# Patient Record
Sex: Female | Born: 1990 | Race: Black or African American | Hispanic: No | Marital: Single | Smoking: Former smoker
Health system: Southern US, Community
[De-identification: ages and names within clinical notes are randomized; demographics above are authoritative.]

## PROBLEM LIST (undated history)

## (undated) DIAGNOSIS — R569 Unspecified convulsions: Secondary | ICD-10-CM

## (undated) DIAGNOSIS — D649 Anemia, unspecified: Secondary | ICD-10-CM

## (undated) DIAGNOSIS — D496 Neoplasm of unspecified behavior of brain: Secondary | ICD-10-CM

## (undated) DIAGNOSIS — F3112 Bipolar disorder, current episode manic without psychotic features, moderate: Secondary | ICD-10-CM

## (undated) HISTORY — PX: BRAIN SURGERY: SHX531

## (undated) HISTORY — PX: MOUTH SURGERY: SHX715

---
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
## 2011-03-01 LAB — URINALYSIS, ROUTINE W REFLEX MICROSCOPIC
Nitrite: NEGATIVE
Protein, ur: NEGATIVE mg/dL
Specific Gravity, Urine: 1.024 (ref 1.005–1.030)
Urobilinogen, UA: 1 mg/dL (ref 0.0–1.0)

## 2011-03-01 LAB — POCT I-STAT, CHEM 8
BUN: 9 mg/dL (ref 6–23)
Creatinine, Ser: 0.8 mg/dL (ref 0.4–1.2)
Potassium: 3.9 mEq/L (ref 3.5–5.1)
Sodium: 141 mEq/L (ref 135–145)

## 2011-03-01 LAB — CBC
HCT: 35.9 % — ABNORMAL LOW (ref 36.0–46.0)
Platelets: 253 10*3/uL (ref 150–400)
RDW: 11.9 % (ref 11.5–15.5)
WBC: 7 10*3/uL (ref 4.0–10.5)

## 2011-03-01 LAB — DIFFERENTIAL
Basophils Relative: 1 % (ref 0–1)
Eosinophils Relative: 2 % (ref 0–5)
Monocytes Absolute: 0.5 10*3/uL (ref 0.1–1.0)
Monocytes Relative: 7 % (ref 3–12)
Neutro Abs: 3.7 10*3/uL (ref 1.7–7.7)

## 2011-03-01 LAB — POCT PREGNANCY, URINE

## 2011-03-01 LAB — COMPREHENSIVE METABOLIC PANEL
ALT: 12 U/L (ref 0–35)
AST: 16 U/L (ref 0–37)
Albumin: 4.4 g/dL (ref 3.5–5.2)
Chloride: 105 mEq/L (ref 96–112)
Creatinine, Ser: 0.67 mg/dL (ref 0.4–1.2)
Potassium: 3.7 mEq/L (ref 3.5–5.1)
Sodium: 135 mEq/L (ref 135–145)
Total Bilirubin: 0.6 mg/dL (ref 0.3–1.2)

## 2011-04-28 NOTE — Discharge Summary (Signed)
[name], [name] NO.:  [id number]   MEDICAL RECORD NO.:  [id number]          PATIENT TYPE:  OBV   LOCATION:  6149                         FACILITY:  [facility]   PHYSICIAN:  Levander Campion, M.D.  DATE OF BIRTH:  [birth year]   DATE OF ADMISSION:  04/12/2007  DATE OF DISCHARGE:  04/13/2007                               DISCHARGE SUMMARY   REASON FOR HOSPITALIZATION:  Seizure-like activity.   SIGNIFICANT FINDINGS:  Carla Munoz is a 20 year old female, with no prior  history of seizure disorder, who presented with a 80-month history of  twitching off and on in all body parts, randomly, for a few seconds at  the time and a 4-day history of episodes preceded by fatigue, blurred  vision, heavy feeling in chest, difficulty breathing and left lower  extremity dragging, in which she has eye rolling, eyelash fluttering and  falling to the floor without memory for the event.  She also has a 4-day  history of burning line on the top of her scalp and intensifies prior to  these events.  Two events were witnessed by teachers at school on  Monday.  Three events on Tuesday.  On Tuesday, the school nurse called  her grandmother to take her to the emergency department.  EMS was not  called on any event.  CT scan of the head was within normal limits, and  MRI of the brain was within normal limits, and EEG was within normal  limits.  She was admitted for observation.  Neurology was consulted and  Dr. Orlin Hilding saw the patient.  A neuro exam was within normal limits.  No  treatment was recommended at this time, and Dr. Orlin Hilding recommended  follow up with Dr. Sharene Skeans.  If the episodes were to recur, she would  consider outpatient ambulatory EEG.  She had no episodes while in the  hospital.  She does have significant stressors in her life with her  grandmother, who has primary custody, having major medical problems and  her mother having passed away when she was 51 years old.  Dr. Lindie Spruce was  consulted and the patient is considering psychiatric follow up with Dr.  Lindie Spruce.   OPERATIONS AND PROCEDURES:  EEG.   FINAL DIAGNOSES:  Seizure-like spells.   DISCHARGE MEDICATIONS AND INSTRUCTIONS:  The patient is to continue  taking her albuterol inhaler p.r.n. for her history of asthma.  Pending  results initially to be followed, none.  Follow up:  The patient is to  follow up with Dr. Sharene Skeans on Monday, Apr 18, 2007, at 10:15 a.m.  She  is to call for follow up at our primary care physician, Dr. Steva Ready, in  [place] within 2 weeks.  Discharge weight is 62.4 kilograms.  Discharge  condition is stable.           ______________________________  Levander Campion, M.D.     JH/MEDQ  D:  04/13/2007  T:  04/13/2007  Job:  [id number]

## 2011-04-28 NOTE — Procedures (Signed)
EEG NUMBER:  [id number]   CLINICAL HISTORY:  Patient is a 20 year old with a 68-month history of  twitching activity and a 4-day history of seizures, 780.39.  Study is  being done to look for the presence of an epileptic focus.   PROCEDURE:  The tracing is carried out on a 32-channel digital Cadwell  recorder reformatted into 16-channel montages with one devoted to EKG.  The patient was awake and asleep during the recording.  The  [employer] 10/20 system lead placement was used.   She takes Tylenol.   DESCRIPTION FINDINGS:  Dominant frequency is a 9-11 Hz, 20-40 microvolt  activity that is well modulated and regulated and attenuates partially  with eye opening.   Background activity consists of a predominately alpha and theta range  activity.  There is a brief portion of dysrhythmic theta range activity  in the left mid temporal region but this is not seen throughout the  record.  There was no interictal epileptiform activity in the form of  spikes or sharp waves.  The patient drifts into natural sleep with  vertex sharp waves and symmetric and synchronous sleep spindles.   Activating procedures with hyperventilation caused arousal in the  background.  Photic stimulation induced a driving response at 9 and 11  Hz.   EKG showed a sinus arrhythmia with ventricular response of 78 beats per  minute.   IMPRESSION:  In the waking state and natural sleep, this record is  normal.      Deanna Artis. Sharene Skeans, M.D.  Electronically Signed     FAO:ZHYQ  D:  04/13/2007 12:44:41  T:  04/13/2007 14:48:24  Job #:  [id number]   cc:   Orie Rout, M.D.  Fax: [phone]

## 2011-04-28 NOTE — Consult Note (Signed)
NAMEUNDRA, TREMBATH NO.:  [id number]   MEDICAL RECORD NO.:  [id number]          PATIENT TYPE:  OBV   LOCATION:  6149                         FACILITY:  [facility]   PHYSICIAN:  Gustavus Messing. Orlin Hilding, M.D.DATE OF BIRTH:  November 13, 1991   DATE OF CONSULTATION:  04/13/2007  DATE OF DISCHARGE:                                 CONSULTATION   NEUROLOGY CONSULTATION:   CHIEF COMPLAINT:  Possible seizure.   HISTORY OF PRESENT ILLNESS:  Carla Munoz is a 20 year old right-handed  otherwise healthy young lady who had some jaw surgery for a cyst in  February of 2008.  On the way home in the car, she had a series of  possible seizures that were attributed to medication.  She was treated  at the emergency room and released.  She was doing fine until about four  days ago when she began having isolated jerking of all four extremities  in a random pattern with no temporal pattern, no loss of consciousness.  By her description, sounds like it was myoclonic.  Then about two days  ago, she was at school and while seated at her desk had several episodes  of loss of consciousness preceded by her awareness with a sharp head  pain which she describes as being in a line and describes it as burning  and then dragging of her left leg.  The episodes have been variously  described as her holding her head and saying she did not feel well,  complaining that her head hurt, her eyes fluttering, face bright red,  left side twitching, jerking really bad, going out of for a minute and  then coming back and saying I am here; I am okay, 30 to 60-second  period where she was unresponsive to her name, phasing out with eyes  opened and staring, tongue dropping out of her mouth on the left side  for 60 seconds and similar type of things.  There was no history of  incontinence, no history of tongue biting, no clear history of tonic-  clonic seizure activity.  She says that she went back to school and had  four  similar episodes the next day which was yesterday, day of  admission, so she was seen by her primary who recommended admission for  workup.   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  She was a term baby without  complications at delivery.  No hospitalizations.  Up-to-date on her  immunizations.  Only medicine she is taking is albuterol on a p.r.n.  basis.   ALLERGIES:  No known drug allergies.   SOCIAL HISTORY:  She was raised by her grandmother, her mother died of  an intracerebral aneurysm, lives in [place].  Father is unknown.  She is  in tenth grade at [employer].  She dances.  She is  involved in [employer] competitions and poetry.  No alcohol, illicit drug  or significant social stressors except for a lot going on at school.   FAMILY HISTORY:  Positive for the  aneurysm.   OBJECTIVE:  VITAL SIGNS:  On exam, her vital signs are stable.  She is  afebrile.  HEENT:  Head is normocephalic, atraumatic.  NECK:  Supple without bruits.  HEART:  Regular rate and rhythm.  NEUROLOGICAL EXAM:  Mental status:  She is awake and alert, oriented  fully.  Normal language and cognition and seems somewhat unconcerned  about her history of these events.  Cranial nerves:  Her pupils are  equal and reactive.  Visual fields are full.  Extraocular movements are  intact.  Facial sensation:  There is normal facial motor activities.  Normal hearing is intact.  Palate is symmetric.  Tongue is midline.  There is no evidence of tongue lacerations.  Motor exam:  She has normal  station and gait, normal bulk, tone and strength, 5/5 strength in all  four extremities.  No drift or satelliting.  No vesiculations, atrophy  or tremor.  No myoclonus that I can observe.  Deep tendon reflexes are  1+.  She has downgoing toes.  Coordination, finger-to-nose, heel-to-shin  normal.  Sensory is normal.   EEG is normal.  MRI is  normal.  Urine drug skin is normal.  CT is  normal.   IMPRESSION:  Various spells by description, some episodes of myoclonus  without loss of consciousness as well as some episodes of loss of  consciousness or lapse of awareness with head, eyes or tongue deviating.  I do not hear any clear descriptions of generalized tonic-clonic  activity, tongue biting or incontinence.  It is not a very cohesive  picture.  Could be seizure, but no obvious source or history of such.   RECOMMENDATIONS:  I think it would be okay to discharge her at this  time.  I would not treat her with anticonvulsants at this point.  If her  spells recur, she should follow up with Dr. Sharene Skeans as an outpatient,  probably with 48-hour ambulatory EEG.      Catherine A. Orlin Hilding, M.D.  Electronically Signed     CAW/MEDQ  D:  04/13/2007  T:  04/13/2007  Job:  [id number]

## 2014-11-30 ENCOUNTER — Encounter (HOSPITAL_COMMUNITY): Payer: Self-pay | Admitting: *Deleted

## 2014-11-30 ENCOUNTER — Emergency Department (HOSPITAL_COMMUNITY)
Admission: EM | Admit: 2014-11-30 | Discharge: 2014-11-30 | Disposition: A | Discharge status: Home / Self Care | Payer: Self-pay | Attending: Emergency Medicine | Admitting: Emergency Medicine

## 2014-11-30 DIAGNOSIS — Z8669 Personal history of other diseases of the nervous system and sense organs: Secondary | ICD-10-CM | POA: Insufficient documentation

## 2014-11-30 DIAGNOSIS — R6889 Other general symptoms and signs: Secondary | ICD-10-CM

## 2014-11-30 DIAGNOSIS — R197 Diarrhea, unspecified: Secondary | ICD-10-CM | POA: Insufficient documentation

## 2014-11-30 DIAGNOSIS — Z720 Tobacco use: Secondary | ICD-10-CM | POA: Insufficient documentation

## 2014-11-30 DIAGNOSIS — R0981 Nasal congestion: Secondary | ICD-10-CM | POA: Insufficient documentation

## 2014-11-30 DIAGNOSIS — R05 Cough: Secondary | ICD-10-CM | POA: Insufficient documentation

## 2014-11-30 DIAGNOSIS — M791 Myalgia: Secondary | ICD-10-CM | POA: Insufficient documentation

## 2014-11-30 DIAGNOSIS — R112 Nausea with vomiting, unspecified: Secondary | ICD-10-CM | POA: Insufficient documentation

## 2014-11-30 DIAGNOSIS — R51 Headache: Secondary | ICD-10-CM | POA: Insufficient documentation

## 2014-11-30 DIAGNOSIS — R509 Fever, unspecified: Secondary | ICD-10-CM | POA: Insufficient documentation

## 2014-11-30 HISTORY — DX: Unspecified convulsions: R56.9

## 2014-11-30 MED ORDER — IBUPROFEN 800 MG PO TABS
800.0000 mg | ORAL_TABLET | Freq: Once | ORAL | Status: AC
  Administered 2014-11-30: 800 mg via ORAL
  Filled 2014-11-30: qty 1

## 2014-11-30 MED ORDER — HYDROCODONE-HOMATROPINE 5-1.5 MG/5ML PO SYRP
5.0000 mL | ORAL_SOLUTION | Freq: Once | ORAL | Status: AC
  Administered 2014-11-30: 5 mL via ORAL
  Filled 2014-11-30: qty 5

## 2014-11-30 MED ORDER — GUAIFENESIN 100 MG/5ML PO LIQD: 100.0000 mg | ORAL | Status: DC | PRN

## 2014-11-30 MED ORDER — LOPERAMIDE HCL 2 MG PO CAPS: 2.0000 mg | ORAL_CAPSULE | Freq: Four times a day (QID) | ORAL | Status: DC | PRN

## 2014-11-30 MED ORDER — ONDANSETRON 4 MG PO TBDP: 4.0000 mg | ORAL_TABLET | Freq: Three times a day (TID) | ORAL | Status: DC | PRN

## 2014-11-30 NOTE — Discharge Instructions (Signed)
Please follow up with your primary care physician in 1-2 days. If you do not have one please call the [place] number listed above. Please use medications as prescribed. Please read all discharge instructions and return precautions.   Influenza Influenza ("the flu") is a viral infection of the respiratory tract. It occurs more often in winter months because people spend more time in close contact with one another. Influenza can make you feel very sick. Influenza easily spreads from person to person (contagious). CAUSES  Influenza is caused by a virus that infects the respiratory tract. You can catch the virus by breathing in droplets from an infected person's cough or sneeze. You can also catch the virus by touching something that was recently contaminated with the virus and then touching your mouth, nose, or eyes. RISKS AND COMPLICATIONS You may be at risk for a more severe case of influenza if you smoke cigarettes, have diabetes, have chronic heart disease (such as heart failure) or lung disease (such as asthma), or if you have a weakened immune system. Elderly people and pregnant women are also at risk for more serious infections. The most common problem of influenza is a lung infection (pneumonia). Sometimes, this problem can require emergency medical care and may be life threatening. SIGNS AND SYMPTOMS  Symptoms typically last 4 to 10 days and may include:  Fever.  Chills.  Headache, body aches, and muscle aches.  Sore throat.  Chest discomfort and cough.  Poor appetite.  Weakness or feeling tired.  Dizziness.  Nausea or vomiting. DIAGNOSIS  Diagnosis of influenza is often made based on your history and a physical exam. A nose or throat swab test can be done to confirm the diagnosis. TREATMENT  In mild cases, influenza goes away on its own. Treatment is directed at relieving symptoms. For more severe cases, your health care provider may prescribe antiviral  medicines to shorten the sickness. Antibiotic medicines are not effective because the infection is caused by a virus, not by bacteria. HOME CARE INSTRUCTIONS  Take medicines only as directed by your health care provider.  Use a cool mist humidifier to make breathing easier.  Get plenty of rest until your temperature returns to normal. This usually takes 3 to 4 days.  Drink enough fluid to keep your urine clear or pale yellow.  Cover yourmouth and nosewhen coughing or sneezing,and wash your handswellto prevent thevirusfrom spreading.  Stay homefromwork orschool untilthe fever is gonefor at least 36full day. PREVENTION  An annual influenza vaccination (flu shot) is the best way to avoid getting influenza. An annual flu shot is now routinely recommended for all adults in the [place] IF:  You experiencechest pain, yourcough worsens,or you producemore mucus.  Youhave nausea,vomiting, ordiarrhea.  Your fever returns or gets worse. SEEK IMMEDIATE MEDICAL CARE IF:  You havetrouble breathing, you become short of breath,or your skin ornails becomebluish.  You have severe painor stiffnessin the neck.  You develop a sudden headache, or pain in the face or ear.  You have nausea or vomiting that you cannot control. MAKE SURE YOU:   Understand these instructions.  Will watch your condition.  Will get help right away if you are not doing well or get worse. Document Released: 11/27/2000 Document Revised: 04/16/2014 Document Reviewed: 02/29/2012 [facility] Patient Information 2015 [place], [place]. This information is not intended to replace advice given to you by your health care provider. Make sure you discuss any questions you have with your health  care provider.

## 2014-11-30 NOTE — ED Notes (Signed)
Pt reports having not feeling well x 2 days. Having headache, bodyaches, n/v/d. No acute distress noted at triage.

## 2014-11-30 NOTE — ED Provider Notes (Signed)
CSN: [id number]     Arrival date & time 11/30/14  1843 History   First MD Initiated Contact with Patient 11/30/14 1921     Chief Complaint  Patient presents with  . Influenza     (Consider location/radiation/quality/duration/timing/severity/associated sxs/prior Treatment) HPI Comments: Patient is a 23 year old female presented to the emergency department for 2 days of subjective fever, chills, generalized headache, myalgias, arthralgias, nausea, vomiting, diarrhea, cough. She has tried [facility] powders with no improvement. No modifying factors identified. She does have 2 positive sick contacts with upper respiratory symptoms at home. She did not receive her influenza vaccination this year yet. No history of IV drug use.  Patient is a 23 y.o. female presenting with flu symptoms.  Influenza Presenting symptoms: cough, diarrhea, fever (subjective), headache, nausea and vomiting   Associated symptoms: chills and nasal congestion     Past Medical History  Diagnosis Date  . Seizures    History reviewed. No pertinent past surgical history. History reviewed. No pertinent family history. History  Substance Use Topics  . Smoking status: Current Every Day Smoker    Types: Cigarettes  . Smokeless tobacco: Not on file  . Alcohol Use: No   OB History    No data available     Review of Systems  Constitutional: Positive for fever (subjective) and chills.  HENT: Positive for congestion.   Respiratory: Positive for cough.   Gastrointestinal: Positive for nausea, vomiting and diarrhea. Negative for abdominal pain.  Neurological: Positive for headaches. Negative for syncope, weakness and numbness.  All other systems reviewed and are negative.     Allergies  Review of patient's allergies indicates no known allergies.  Home Medications   Prior to Admission medications   Medication Sig Start Date End Date Taking? Authorizing Provider  guaiFENesin (ROBITUSSIN) 100 MG/5ML liquid Take 5-10 mLs  (100-200 mg total) by mouth every 4 (four) hours as needed for cough. 11/30/14   Clearance Chenault L Nema Oatley, PA-C  loperamide (IMODIUM) 2 MG capsule Take 1 capsule (2 mg total) by mouth 4 (four) times daily as needed for diarrhea or loose stools. 11/30/14   Elis Rawlinson L Sharece Fleischhacker, PA-C  ondansetron (ZOFRAN ODT) 4 MG disintegrating tablet Take 1 tablet (4 mg total) by mouth every 8 (eight) hours as needed for nausea or vomiting. 11/30/14   Anderson Malta L Harlem Bula, PA-C   BP 103/67 mmHg  Pulse 68  Temp(Src) 98.3 F (36.8 C) (Oral)  Resp 18  SpO2 97%  LMP 11/16/2014 Physical Exam  Constitutional: She is oriented to person, place, and time. She appears well-developed and well-nourished. No distress.  HENT:  Head: Normocephalic and atraumatic.  Right Ear: Hearing, tympanic membrane, external ear and ear canal normal.  Left Ear: Hearing, tympanic membrane, external ear and ear canal normal.  Nose: Nose normal.  Mouth/Throat: Uvula is midline, oropharynx is clear and moist and mucous membranes are normal. No oropharyngeal exudate.  Eyes: Conjunctivae are normal.  Neck: Normal range of motion. Neck supple.  Cardiovascular: Normal rate, regular rhythm and normal heart sounds.   Pulmonary/Chest: Effort normal and breath sounds normal. No respiratory distress.  Abdominal: Soft. Bowel sounds are normal. She exhibits no distension. There is no tenderness. There is no rebound and no guarding.  Musculoskeletal: Normal range of motion.  Lymphadenopathy:    She has no cervical adenopathy.  Neurological: She is alert and oriented to person, place, and time.  Skin: Skin is warm and dry. She is not diaphoretic.  Psychiatric: She has a normal mood and  affect.  Nursing note and vitals reviewed.   ED Course  Procedures (including critical care time) Medications  ibuprofen (ADVIL,MOTRIN) tablet 800 mg (800 mg Oral Given 11/30/14 2004)  HYDROcodone-homatropine (HYCODAN) 5-1.5 MG/5ML syrup 5 mL (5 mLs Oral  Given 11/30/14 2004)    Labs Review Labs Reviewed - No data to display  Imaging Review No results found.   EKG Interpretation None      MDM   Final diagnoses:  Flu-like symptoms    Filed Vitals:   11/30/14 2050  BP: 103/67  Pulse: 68  Temp:   Resp:    Afebrile, NAD, non-toxic appearing, AAOx4.  Patient with symptoms consistent with influenza.  Vitals are stable, low-grade fever.  No signs of dehydration, tolerating PO's.  Lungs are clear. Due to patient's presentation and physical exam a chest x-ray was not ordered bc likely diagnosis of flu.  Discussed the cost versus benefit of Tamiflu treatment with the patient.  The patient understands that symptoms are greater than the recommended 24-48 hour window of treatment.  Patient will be discharged with instructions to orally hydrate, rest, and use over-the-counter medications such as anti-inflammatories ibuprofen and Aleve for muscle aches and Tylenol for fever.  Patient will also be given a cough suppressant.  Patient is stable at time of discharge    Harlow Mares, PA-C 11/30/14 2108  Charlesetta Shanks, MD 11/30/14 2234

## 2014-12-12 ENCOUNTER — Encounter (HOSPITAL_COMMUNITY): Payer: Self-pay

## 2014-12-12 ENCOUNTER — Emergency Department (HOSPITAL_COMMUNITY): Payer: Self-pay

## 2014-12-12 ENCOUNTER — Emergency Department (HOSPITAL_COMMUNITY)
Admission: EM | Admit: 2014-12-12 | Discharge: 2014-12-12 | Disposition: A | Discharge status: Home / Self Care | Payer: Self-pay | Attending: Emergency Medicine | Admitting: Emergency Medicine

## 2014-12-12 DIAGNOSIS — Z86011 Personal history of benign neoplasm of the brain: Secondary | ICD-10-CM | POA: Insufficient documentation

## 2014-12-12 DIAGNOSIS — R569 Unspecified convulsions: Secondary | ICD-10-CM | POA: Insufficient documentation

## 2014-12-12 DIAGNOSIS — Y9289 Other specified places as the place of occurrence of the external cause: Secondary | ICD-10-CM | POA: Insufficient documentation

## 2014-12-12 DIAGNOSIS — Y9389 Activity, other specified: Secondary | ICD-10-CM | POA: Insufficient documentation

## 2014-12-12 DIAGNOSIS — Z720 Tobacco use: Secondary | ICD-10-CM | POA: Insufficient documentation

## 2014-12-12 DIAGNOSIS — W07XXXA Fall from chair, initial encounter: Secondary | ICD-10-CM | POA: Insufficient documentation

## 2014-12-12 DIAGNOSIS — R52 Pain, unspecified: Secondary | ICD-10-CM

## 2014-12-12 DIAGNOSIS — Y990 Civilian activity done for income or pay: Secondary | ICD-10-CM | POA: Insufficient documentation

## 2014-12-12 DIAGNOSIS — W19XXXA Unspecified fall, initial encounter: Secondary | ICD-10-CM

## 2014-12-12 DIAGNOSIS — S0990XA Unspecified injury of head, initial encounter: Secondary | ICD-10-CM | POA: Insufficient documentation

## 2014-12-12 HISTORY — DX: Neoplasm of unspecified behavior of brain: D49.6

## 2014-12-12 LAB — BASIC METABOLIC PANEL
ANION GAP: 6 (ref 5–15)
BUN: 9 mg/dL (ref 6–23)
CHLORIDE: 109 meq/L (ref 96–112)
CO2: 22 mmol/L (ref 19–32)
Calcium: 9.1 mg/dL (ref 8.4–10.5)
Creatinine, Ser: 0.56 mg/dL (ref 0.50–1.10)
Glucose, Bld: 86 mg/dL (ref 70–99)
POTASSIUM: 4 mmol/L (ref 3.5–5.1)
SODIUM: 137 mmol/L (ref 135–145)

## 2014-12-12 LAB — CBC
HEMATOCRIT: 38 % (ref 36.0–46.0)
Hemoglobin: 12.9 g/dL (ref 12.0–15.0)
MCH: 31.1 pg (ref 26.0–34.0)
MCHC: 33.9 g/dL (ref 30.0–36.0)
MCV: 91.6 fL (ref 78.0–100.0)
PLATELETS: 233 10*3/uL (ref 150–400)
RBC: 4.15 MIL/uL (ref 3.87–5.11)
RDW: 11.7 % (ref 11.5–15.5)
WBC: 6.6 10*3/uL (ref 4.0–10.5)

## 2014-12-12 LAB — I-STAT BETA HCG BLOOD, ED (MC, WL, AP ONLY)

## 2014-12-12 MED ORDER — ACETAMINOPHEN 325 MG PO TABS
650.0000 mg | ORAL_TABLET | Freq: Once | ORAL | Status: AC
Start: 2014-12-12 — End: 2014-12-12
  Administered 2014-12-12: 650 mg via ORAL
  Filled 2014-12-12: qty 2

## 2014-12-12 NOTE — ED Notes (Signed)
MD at bedside. 

## 2014-12-12 NOTE — ED Notes (Signed)
Pt brother stated that he wanted to take the pt home. Pt asked if she wanted to leave. Pt stated that she would stay if doctor said it was in her best interest to. Dr Ashok Cordia made aware.

## 2014-12-12 NOTE — ED Notes (Signed)
Per [facility]: Pt found on floor at work by co-worker. Pt was sitting in chair before coworker left room, when coworker came back to room pt was found on floor. Unknown how long pt was on floor. Pt was postictal when [facility] arrived on scene, symptoms have since resolved. Pt placed in C-collar because pt was complaining of neck pain that radiates down in between shoulder blades. No deformities and no tenderness noted by [facility].   Pt stated that she believes she had a seizure last night, last known seizure was last week.  Pt is not on medications for seizure due to insurance problems.  Pt does have a hx of seizures. 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

## 2014-12-12 NOTE — Discharge Instructions (Signed)
It was our pleasure to provide your ER care today - we hope that you feel better.  Rest. Drink plenty of fluids.  Take tylenol/advil as need.  No driving, or operating heavy machinery, until cleared to do so by your doctor/neurologist.  Follow up with neurologist in the next 1-2 weeks - see referral - call office today to arrange appointment.  Return to ER right away if worse, recurrent seizures, fainting, fevers, severe pain, other concern.    Seizure, Adult A seizure is abnormal electrical activity in the brain. Seizures usually last from 30 seconds to 2 minutes. There are various types of seizures. Before a seizure, you may have a warning sensation (aura) that a seizure is about to occur. An aura may include the following symptoms:   Fear or anxiety.  Nausea.  Feeling like the room is spinning (vertigo).  Vision changes, such as seeing flashing lights or spots. Common symptoms during a seizure include:  A change in attention or behavior (altered mental status).  Convulsions with rhythmic jerking movements.  Drooling.  Rapid eye movements.  Grunting.  Loss of bladder and bowel control.  Bitter taste in the mouth.  Tongue biting. After a seizure, you may feel confused and sleepy. You may also have an injury resulting from convulsions during the seizure. HOME CARE INSTRUCTIONS   If you are given medicines, take them exactly as prescribed by your health care provider.  Keep all follow-up appointments as directed by your health care provider.  Do not swim or drive or engage in risky activity during which a seizure could cause further injury to you or others until your health care provider says it is OK.  Get adequate rest.  Teach friends and family what to do if you have a seizure. They should:  Lay you on the ground to prevent a fall.  Put a cushion under your head.  Loosen any tight clothing around your neck.  Turn you on your side. If vomiting occurs, this  helps keep your airway clear.  Stay with you until you recover.  Know whether or not you need emergency care. SEEK IMMEDIATE MEDICAL CARE IF:  The seizure lasts longer than 5 minutes.  The seizure is severe or you do not wake up immediately after the seizure.  You have an altered mental status after the seizure.  You are having more frequent or worsening seizures. Someone should drive you to the emergency department or call local emergency services (911 in [place] MAKE SURE YOU:  Understand these instructions.  Will watch your condition.  Will get help right away if you are not doing well or get worse. Document Released: 11/27/2000 Document Revised: 09/20/2013 Document Reviewed: 07/12/2013 [facility] Patient Information 2015 [place], [place]. This information is not intended to replace advice given to you by your health care provider. Make sure you discuss any questions you have with your health care provider.

## 2014-12-12 NOTE — ED Notes (Signed)
Pt states that prior to having a seizure she "feels really really hot and I need to sit down".

## 2014-12-12 NOTE — ED Notes (Signed)
MD removed pts c-collar

## 2014-12-12 NOTE — ED Provider Notes (Signed)
CSN: [id number]     Arrival date & time 12/12/14  1004 History   First MD Initiated Contact with Patient 12/12/14 1009     Chief Complaint  Patient presents with  . Seizures     (Consider location/radiation/quality/duration/timing/severity/associated sxs/prior Treatment) Patient is a 23 y.o. female presenting with seizures. The history is provided by the patient and the EMS personnel.  Seizures pt w hx seizures, presents from work where pt found on floor by co-worker after having suspected seizure. ?briefly 'out of it' initially, however sensorium cleared by time of ED arrival.  Pt states she thinks she had seizure.  Pt indicates was at work, felt hot, faint, went to sit down in chair, and then to ground.  Pt indicates similar symptoms prior to previous seizures. Denies hx syncopal events.  Pt indicates slept poorly last pm, otherwise recent health at baseline. No fevers. No dysuria or gu c/o. No recent blood loss or heavy periods. No nvd recently, although had a nv illness a couple weeks ago. No abd pain. No cp or sob. No palpitations. No headache prior to event although after fall to ground c/o headache. Dull. Moderate. Pt indicates has seizure every few months, no recent increase in seizure frequency. Pt states regarding seizures has never been on meds for same, despite hx seizures since '9th grade'.  Pt states she doesn't have the 'grand mal' type of seizures, but that occasionally her body will stiffen or shake.  No incontinence or oral injuries w current or prior seizures.       Past Medical History  Diagnosis Date  . Seizures   . Brain tumor    History reviewed. No pertinent past surgical history. No family history on file. History  Substance Use Topics  . Smoking status: Current Every Day Smoker -- 0.50 packs/day    Types: Cigarettes  . Smokeless tobacco: Not on file  . Alcohol Use: Yes     Comment: socially   OB History    No data available     Review of Systems   Constitutional: Negative for fever and chills.  HENT: Negative for sore throat.   Eyes: Negative for pain and visual disturbance.  Respiratory: Negative for cough and shortness of breath.   Cardiovascular: Negative for chest pain and leg swelling.  Gastrointestinal: Negative for vomiting, abdominal pain, diarrhea and blood in stool.  Endocrine: Negative for polyuria.  Genitourinary: Negative for dysuria, flank pain and vaginal bleeding.  Musculoskeletal: Negative for back pain.  Skin: Negative for wound.  Neurological: Positive for seizures. Negative for weakness and numbness.  Hematological: Does not bruise/bleed easily.  Psychiatric/Behavioral: Negative for confusion.      Allergies  Review of patient's allergies indicates no known allergies.  Home Medications   Prior to Admission medications   Medication Sig Start Date End Date Taking? Authorizing Provider  guaiFENesin (ROBITUSSIN) 100 MG/5ML liquid Take 5-10 mLs (100-200 mg total) by mouth every 4 (four) hours as needed for cough. 11/30/14   Jennifer L Piepenbrink, PA-C  loperamide (IMODIUM) 2 MG capsule Take 1 capsule (2 mg total) by mouth 4 (four) times daily as needed for diarrhea or loose stools. 11/30/14   Jennifer L Piepenbrink, PA-C  ondansetron (ZOFRAN ODT) 4 MG disintegrating tablet Take 1 tablet (4 mg total) by mouth every 8 (eight) hours as needed for nausea or vomiting. 11/30/14   Anderson Malta L Piepenbrink, PA-C   BP 113/74 mmHg  Pulse 74  Temp(Src) 98.4 F (36.9 C) (Oral)  Resp 16  Ht 5\' 7"  (1.702 m)  Wt 147 lb (66.679 kg)  BMI 23.02 kg/m2  SpO2 100%  LMP 11/16/2014 Physical Exam  Constitutional: She is oriented to person, place, and time. She appears well-developed and well-nourished. No distress.  HENT:  Mouth/Throat: Oropharynx is clear and moist.  Mild tenderness posterior scalp. No oral injury.   Eyes: Conjunctivae are normal. Pupils are equal, round, and reactive to light. No scleral icterus.  Neck:  Neck supple. No tracheal deviation present.  Cardiovascular: Normal rate, regular rhythm, normal heart sounds and intact distal pulses.   Pulmonary/Chest: Effort normal and breath sounds normal. No respiratory distress. She exhibits no tenderness.  Abdominal: Soft. Normal appearance and bowel sounds are normal. She exhibits no distension. There is no tenderness.  Genitourinary:  No cva tenderness  Musculoskeletal: Normal range of motion. She exhibits no edema or tenderness.  Mid cervical tenderness, otherwise CTLS spine, non tender, aligned, no step off.   Neurological: She is alert and oriented to person, place, and time. No cranial nerve deficit.  Motor intact bil, stre 5/5 bil. sens intact.   Skin: Skin is warm and dry. No rash noted. She is not diaphoretic.  Psychiatric: She has a normal mood and affect.  Nursing note and vitals reviewed.   ED Course  Procedures (including critical care time) Labs Review  Results for orders placed or performed during the hospital encounter of 12/12/14  CBC  Result Value Ref Range   WBC 6.6 4.0 - 10.5 K/uL   RBC 4.15 3.87 - 5.11 MIL/uL   Hemoglobin 12.9 12.0 - 15.0 g/dL   HCT 38.0 36.0 - 46.0 %   MCV 91.6 78.0 - 100.0 fL   MCH 31.1 26.0 - 34.0 pg   MCHC 33.9 30.0 - 36.0 g/dL   RDW 11.7 11.5 - 15.5 %   Platelets 233 150 - 400 K/uL  Basic metabolic panel  Result Value Ref Range   Sodium 137 135 - 145 mmol/L   Potassium 4.0 3.5 - 5.1 mmol/L   Chloride 109 96 - 112 mEq/L   CO2 22 19 - 32 mmol/L   Glucose, Bld 86 70 - 99 mg/dL   BUN 9 6 - 23 mg/dL   Creatinine, Ser 0.56 0.50 - 1.10 mg/dL   Calcium 9.1 8.4 - 10.5 mg/dL   GFR calc non Af Amer >90 >90 mL/min   GFR calc Af Amer >90 >90 mL/min   Anion gap 6 5 - 15  I-Stat Beta hCG blood, ED ([facility], [facility], [facility] only)  Result Value Ref Range   I-stat hCG, quantitative <5.0 <5 mIU/mL   Comment 3           Ct Head Wo Contrast  12/12/2014   CLINICAL DATA:  Patient found down. Initial encounter.  Syncopal episode. Possible seizure. Seizure last week. History of seizure. Brain tumor diagnosed 3-4 years ago. Neck pain.  EXAM: CT HEAD WITHOUT CONTRAST  CT CERVICAL SPINE WITHOUT CONTRAST  TECHNIQUE: Multidetector CT imaging of the head and cervical spine was performed following the standard protocol without intravenous contrast. Multiplanar CT image reconstructions of the cervical spine were also generated.  COMPARISON:  MRI 04/12/2007.  CT 06/22/2013 and 06/16/2013.  FINDINGS: CT HEAD FINDINGS  No mass lesion, mass effect, midline shift, hydrocephalus, hemorrhage. No territorial ischemia or acute infarction. Unchanged calcification and ossification of the falx and tentorium.  There is an abnormal configuration of the clivus which is unchanged compared to the prior, compatible with congenital abnormality. Exostosis extends  off the central clivus contacting the medulla. No change from prior.  CT CERVICAL SPINE FINDINGS  Alignment: Levoconvex curve of the cervicothoracic spine. Compensatory dextroconvex curve with the apex at C2. No spondylolisthesis.  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 Fibrous fusion is probably present anteriorly. No findings to suggest instability.  Vertebrae: T1 spinous process shows failure of fusion. Segmentation is within normal limits. Negative for fracture.  Central canal: Patent.  Paraspinal soft tissues: Normal.  Lung apices: Normal.  Failure of fusion of the T1 spinous process.  IMPRESSION: 1. No acute intracranial abnormality. 2. 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 3. No acute cervical spine injury. 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   Electronically Signed   By: Dereck Ligas M.D.   On: 12/12/2014 11:15    Ct Cervical Spine Wo Contrast  12/12/2014   CLINICAL DATA:  Patient found down. Initial encounter. Syncopal episode. Possible seizure. Seizure last week. History of seizure. Brain tumor diagnosed 3-4 years ago. Neck pain.  EXAM: CT HEAD WITHOUT CONTRAST  CT CERVICAL SPINE WITHOUT CONTRAST  TECHNIQUE: Multidetector CT imaging of the head and cervical spine was performed following the standard protocol without intravenous contrast. Multiplanar CT image reconstructions of the cervical spine were also generated.  COMPARISON:  MRI 04/12/2007.  CT 06/22/2013 and 06/16/2013.  FINDINGS: CT HEAD FINDINGS  No mass lesion, mass effect, midline shift, hydrocephalus, hemorrhage. No territorial ischemia or acute infarction. Unchanged calcification and ossification of the falx and tentorium.  There is an abnormal configuration of the clivus which is unchanged compared to the prior, compatible with congenital abnormality. Exostosis extends off the central clivus contacting the medulla. No change from prior.  CT CERVICAL SPINE FINDINGS  Alignment: Levoconvex curve of the cervicothoracic spine. Compensatory dextroconvex curve with the apex at C2. No spondylolisthesis.  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 Fibrous fusion is probably present anteriorly. No findings to suggest instability.  Vertebrae: T1 spinous process shows failure of fusion. Segmentation is within normal limits. Negative for fracture.  Central canal: Patent.  Paraspinal soft tissues: Normal.  Lung apices: Normal.  Failure of fusion of the T1 spinous process.  IMPRESSION: 1. No acute intracranial abnormality. 2. 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 3. No acute cervical spine injury. 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   Electronically Signed   By: Dereck Ligas M.D.   On: 12/12/2014 11:15       EKG Interpretation   Date/Time:  Wednesday December 12 2014 10:06:30 EST Ventricular Rate:  69 PR Interval:  134 QRS Duration: 86 QT Interval:  373 QTC Calculation: 399 R Axis:   59 Text Interpretation:  Normal sinus rhythm Normal ECG Confirmed by Dylyn Mclaren   MD, Lennette Bihari ([id number]) on 12/12/2014 10:16:35 AM      MDM   Iv ns. Seizure precautions.  Reviewed nursing notes and prior charts for additional history.   Discussed cts w pt-  Pt/family state they have had bony abn clivus previously worked up, and will follow up with their provider.  No  seizure activity in ED.  Pt and family request d/c.   Pt does not want any medication currently, or rx, but states is willing to follow with neurology as outpatient.  Recheck spine nt.  Fam/pt again requesting d/c to home, asymptomatic, does not want urine or other tests.  Pt d/c to home.  Return precautions provided, and referral to neurology.    Mirna Mires, MD 12/12/14 [phone]

## 2014-12-12 NOTE — ED Notes (Signed)
Patient transported to CT 

## 2014-12-12 NOTE — ED Notes (Signed)
Pt given water, as directed by MD.

## 2014-12-25 ENCOUNTER — Emergency Department (HOSPITAL_COMMUNITY)
Admission: EM | Admit: 2014-12-25 | Discharge: 2014-12-25 | Disposition: A | Discharge status: Home / Self Care | Payer: Self-pay | Attending: Emergency Medicine | Admitting: Emergency Medicine

## 2014-12-25 ENCOUNTER — Emergency Department (HOSPITAL_COMMUNITY): Payer: Self-pay

## 2014-12-25 ENCOUNTER — Encounter (HOSPITAL_COMMUNITY): Payer: Self-pay | Admitting: Emergency Medicine

## 2014-12-25 DIAGNOSIS — Y9289 Other specified places as the place of occurrence of the external cause: Secondary | ICD-10-CM | POA: Insufficient documentation

## 2014-12-25 DIAGNOSIS — Z7982 Long term (current) use of aspirin: Secondary | ICD-10-CM | POA: Insufficient documentation

## 2014-12-25 DIAGNOSIS — S8992XA Unspecified injury of left lower leg, initial encounter: Secondary | ICD-10-CM | POA: Insufficient documentation

## 2014-12-25 DIAGNOSIS — D332 Benign neoplasm of brain, unspecified: Secondary | ICD-10-CM | POA: Insufficient documentation

## 2014-12-25 DIAGNOSIS — Y990 Civilian activity done for income or pay: Secondary | ICD-10-CM | POA: Insufficient documentation

## 2014-12-25 DIAGNOSIS — S199XXA Unspecified injury of neck, initial encounter: Secondary | ICD-10-CM | POA: Insufficient documentation

## 2014-12-25 DIAGNOSIS — Z3202 Encounter for pregnancy test, result negative: Secondary | ICD-10-CM | POA: Insufficient documentation

## 2014-12-25 DIAGNOSIS — Z720 Tobacco use: Secondary | ICD-10-CM | POA: Insufficient documentation

## 2014-12-25 DIAGNOSIS — G40909 Epilepsy, unspecified, not intractable, without status epilepticus: Secondary | ICD-10-CM | POA: Insufficient documentation

## 2014-12-25 DIAGNOSIS — S4992XA Unspecified injury of left shoulder and upper arm, initial encounter: Secondary | ICD-10-CM | POA: Insufficient documentation

## 2014-12-25 DIAGNOSIS — Y93E5 Activity, floor mopping and cleaning: Secondary | ICD-10-CM | POA: Insufficient documentation

## 2014-12-25 DIAGNOSIS — W19XXXA Unspecified fall, initial encounter: Secondary | ICD-10-CM

## 2014-12-25 DIAGNOSIS — R569 Unspecified convulsions: Secondary | ICD-10-CM

## 2014-12-25 DIAGNOSIS — W1830XA Fall on same level, unspecified, initial encounter: Secondary | ICD-10-CM | POA: Insufficient documentation

## 2014-12-25 LAB — BASIC METABOLIC PANEL
Anion gap: 11 (ref 5–15)
BUN: 13 mg/dL (ref 6–23)
CALCIUM: 9.3 mg/dL (ref 8.4–10.5)
CHLORIDE: 106 meq/L (ref 96–112)
CO2: 22 mmol/L (ref 19–32)
CREATININE: 0.67 mg/dL (ref 0.50–1.10)
GFR calc Af Amer: >90 mL/min (ref >90)
GFR calc non Af Amer: >90 mL/min (ref >90)
GLUCOSE: 76 mg/dL (ref 70–99)
Potassium: 3.9 mmol/L (ref 3.5–5.1)
Sodium: 139 mmol/L (ref 135–145)

## 2014-12-25 LAB — CBC
HCT: 36.8 % (ref 36.0–46.0)
HEMOGLOBIN: 12.6 g/dL (ref 12.0–15.0)
MCH: 30.7 pg (ref 26.0–34.0)
MCHC: 34.2 g/dL (ref 30.0–36.0)
MCV: 89.5 fL (ref 78.0–100.0)
PLATELETS: 262 10*3/uL (ref 150–400)
RBC: 4.11 MIL/uL (ref 3.87–5.11)
RDW: 11.7 % (ref 11.5–15.5)
WBC: 6.7 10*3/uL (ref 4.0–10.5)

## 2014-12-25 LAB — POC URINE PREG, ED: PREG TEST UR: NEGATIVE

## 2014-12-25 LAB — CBG MONITORING, ED: Glucose-Capillary: 74 mg/dL (ref 70–99)

## 2014-12-25 LAB — VALPROIC ACID LEVEL: Valproic Acid Lvl: <10 ug/mL — ABNORMAL LOW (ref 50.0–100.0)

## 2014-12-25 MED ORDER — KETOROLAC TROMETHAMINE 30 MG/ML IJ SOLN
30.0000 mg | Freq: Once | INTRAMUSCULAR | Status: AC
  Administered 2014-12-25: 30 mg via INTRAVENOUS
  Filled 2014-12-25: qty 1

## 2014-12-25 MED ORDER — HYDROCODONE-ACETAMINOPHEN 5-325 MG PO TABS
1.0000 | ORAL_TABLET | Freq: Once | ORAL | Status: AC
  Administered 2014-12-25: 1 via ORAL
  Filled 2014-12-25: qty 1

## 2014-12-25 NOTE — ED Notes (Signed)
PA at bedside.

## 2014-12-25 NOTE — ED Notes (Signed)
Pt arrives via EMS. Pt at work, had seizure and fell to ground. Pt c/o of head and neck pain, immobilized with spinal board and head blocks. Hx of seizure, supposed to be on depakote but unable to afford it at this time. Pt post ictal upon ems arrival, pt alert and oriented at this time. NAD

## 2014-12-25 NOTE — ED Notes (Signed)
C collar removed per PA Geiple's request

## 2014-12-25 NOTE — Discharge Instructions (Signed)
Please read and follow all provided instructions.  Your diagnoses today include:  1. Seizure-like activity   2. Fall     Tests performed today include:  X-ray of neck - no broken bones  Blood counts and electrolytes - normal  Urine test - no pregnancy  EKG - no problems  Vital signs. See below for your results today.   Medications prescribed:   None  Take any prescribed medications only as directed.  Home care instructions:  Follow any educational materials contained in this packet.  No driving until cleared by your doctor.   Follow-up instructions: Please follow-up with your primary care provider and the neurologist listed for further evaluation of your symptoms.   Return instructions:  SEEK IMMEDIATE MEDICAL ATTENTION IF:  There is confusion or drowsiness.   You cannot awaken the person.   You have more than one episode of vomiting.   You notice dizziness or unsteadiness which is getting worse, or inability to walk.   You have convulsions or unconsciousness.   You experience severe, persistent headaches not relieved by Tylenol.  You cannot use arms or legs normally.   There are changes in pupil sizes. (This is the black center in the colored part of the eye)   There is clear or bloody discharge from the nose or ears.   You have change in speech, vision, swallowing, or understanding.   Localized weakness, numbness, tingling, or change in bowel or bladder control.  You have any other emergent concerns.   Your vital signs today were: BP 96/73 mmHg   Pulse 66   Temp(Src) 98.3 F (36.8 C) (Oral)   Resp 14   SpO2 97%   LMP 12/23/2014 If your blood pressure (BP) was elevated above 135/85 this visit, please have this repeated by your doctor within one month. --------------

## 2014-12-25 NOTE — ED Provider Notes (Signed)
CSN: [id number]     Arrival date & time 12/25/14  1514 History   First MD Initiated Contact with Patient 12/25/14 1613     Chief Complaint  Patient presents with  . Seizures  . Fall     (Consider location/radiation/quality/duration/timing/severity/associated sxs/prior Treatment) HPI Comments: Patients with questionable history of seizure disorder, normal EEG in 2008, not on antiepileptics, currently unable to see neurologist because of insurance problems -- presents with complaint of seizure episode today. Patient was at work Engineer, structural. She reports that she felt bad and had no specific prodrome. She fell to the ground. No eyewitnesses available to give account of activity. Patient has been told that she has a jerking motion in the past. She currently complains of neck pain, left shoulder and left knee pain. Patient was placed on long spine board and in cervical spine collar prior to arrival by EMS. EMS reports postictal state upon their arrival. No nausea, vomiting. No vision changes. No tongue biting or incontinence. No chest pain, shortness of breath. Patient was seen in emergency department 2 weeks ago after a similar episode. She had negative head and cervical spine CT at that time. No other complaints.  The history is provided by the patient and medical records.    Past Medical History  Diagnosis Date  . Seizures   . Brain tumor    History reviewed. No pertinent past surgical history. History reviewed. No pertinent family history. History  Substance Use Topics  . Smoking status: Current Every Day Smoker -- 0.50 packs/day    Types: Cigarettes  . Smokeless tobacco: Not on file  . Alcohol Use: Yes     Comment: socially   OB History    No data available     Review of Systems  Constitutional: Positive for fatigue. Negative for fever.  HENT: Negative for rhinorrhea and sore throat.   Eyes: Negative for redness.  Respiratory: Negative for cough.   Cardiovascular: Negative  for chest pain.  Gastrointestinal: Negative for nausea, vomiting, abdominal pain and diarrhea.  Genitourinary: Negative for dysuria.  Musculoskeletal: Negative for myalgias.  Skin: Negative for rash.  Neurological: Positive for seizures and syncope. Negative for weakness and headaches.    Allergies  Review of patient's allergies indicates no known allergies.  Home Medications   Prior to Admission medications   Medication Sig Start Date End Date Taking? Authorizing Provider  aspirin-acetaminophen-caffeine (EXCEDRIN MIGRAINE) [phone] MG per tablet Take 2 tablets by mouth every 6 (six) hours as needed for headache.   Yes Historical Provider, MD   BP 105/68 mmHg  Pulse 71  Temp(Src) 98.3 F (36.8 C) (Oral)  SpO2 100%  LMP 12/23/2014   Physical Exam  Constitutional: She is oriented to person, place, and time. She appears well-developed and well-nourished.  HENT:  Head: Normocephalic and atraumatic.  Right Ear: Tympanic membrane, external ear and ear canal normal.  Left Ear: Tympanic membrane, external ear and ear canal normal.  Nose: Nose normal.  Mouth/Throat: Uvula is midline, oropharynx is clear and moist and mucous membranes are normal.  No lateral tongue biting.  Eyes: Conjunctivae, EOM and lids are normal. Pupils are equal, round, and reactive to light. Right eye exhibits no discharge. Left eye exhibits no discharge. Right eye exhibits no nystagmus. Left eye exhibits no nystagmus.  Neck: Neck supple.  Immobilized in c-collar. Lower c-spine midline tenderness.   Cardiovascular: Normal rate, regular rhythm and normal heart sounds.   Pulmonary/Chest: Effort normal and breath sounds normal.  Abdominal: Soft.  There is no tenderness.  Musculoskeletal:       Right shoulder: Normal.       Left shoulder: She exhibits tenderness. She exhibits normal range of motion and no bony tenderness.       Right elbow: Normal.      Left elbow: Normal.       Right wrist: Normal.       Left  wrist: Normal.       Right hip: Normal.       Left hip: Normal.       Right knee: Normal.       Left knee: She exhibits normal range of motion, no swelling and no effusion. Tenderness found.       Right ankle: Normal.       Left ankle: Normal.       Cervical back: She exhibits normal range of motion, no tenderness and no bony tenderness.       Thoracic back: Normal.       Lumbar back: Normal.  Neurological: She is alert and oriented to person, place, and time. She has normal strength and normal reflexes. No cranial nerve deficit or sensory deficit. She displays a negative Romberg sign. Coordination and gait normal. GCS eye subscore is 4. GCS verbal subscore is 5. GCS motor subscore is 6.  Skin: Skin is warm and dry.  Psychiatric: She has a normal mood and affect.  Nursing note and vitals reviewed.   ED Course  Procedures (including critical care time) Labs Review Labs Reviewed  VALPROIC ACID LEVEL - Abnormal; Notable for the following:    Valproic Acid Lvl <10.0 (*)    All other components within normal limits  CBC  BASIC METABOLIC PANEL  CBG MONITORING, ED  POC URINE PREG, ED    Imaging Review Dg Cervical Spine Complete  12/25/2014   CLINICAL DATA:  Fall.  Seizures.  Neck pain.  EXAM: CERVICAL SPINE  4+ VIEWS  COMPARISON:  12/12/2014  FINDINGS: Imaging was performed with patient wearing a cervical collar.  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  No significant prevertebral soft tissue swelling. No malalignment. No cervical fracture is identified. Stable dextroconvex upper cervical scoliosis as shown on prior CT scan. 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  No cervical spine fracture or subluxation is identified.  IMPRESSION: 1. No cervical spine fracture or in collar static instability identified. 2. Incidental note is made of lack of fusion of the anterior and posterior arches of C1, in the posterior arch of T1. This is stable.   Electronically Signed    By: Sherryl Barters M.D.   On: 12/25/2014 18:12     EKG Interpretation   Date/Time:  Tuesday December 25 2014 15:55:38 EST Ventricular Rate:  72 PR Interval:  153 QRS Duration: 80 QT Interval:  391 QTC Calculation: 428 R Axis:   69 Text Interpretation:  Sinus rhythm EKG WITHIN NORMAL LIMITS Confirmed by  DOCHERTY  MD, MEGAN [phone]) on 12/25/2014 5:44:28 PM       Patient seen and examined.    Vital signs reviewed and are as follows: BP 105/68 mmHg  Pulse 71  Temp(Src) 98.3 F (36.8 C) (Oral)  SpO2 100%  LMP 12/23/2014  7:38 PM c-collar removed by nurse after negative imaging. Patient has full range of motion in neck with residual soreness. Patient has done well during emergency department stay. No further seizure-like activity. EKG is normal. She is not orthostatic. Plain film imaging  of her neck is negative. Headache is improved with Toradol. Will discharge to home. We discussed driving restrictions. I will give her PCP and neurology follow-up. I encouraged her to follow-up as soon as she can. Patient encouraged to return to the emergency department with additional episodes of seizures, severe headache, other concerns.  MDM   Final diagnoses:  Fall  Seizure-like activity   Patient with possible seizure. Neg syncope/seizure work-up. No additional seizures in ED. No focal neuro deficits. No c-spine problems. Unfortunately she does not have good follow-up. I have provided referrals today for PCP/neuro.   No dangerous or life-threatening conditions suspected or identified by history, physical exam, and by work-up. No indications for hospitalization identified.       Carlisle Cater, PA-C 12/25/14 1946  Ernestina Patches, MD 12/26/14 1227

## 2014-12-25 NOTE — ED Notes (Signed)
Patient transported to X-ray 

## 2014-12-25 NOTE — ED Notes (Signed)
[facility] contacted in reference to UPT, advised results were just put into computer.

## 2015-01-16 ENCOUNTER — Encounter (HOSPITAL_COMMUNITY): Payer: Self-pay | Admitting: Physical Medicine and Rehabilitation

## 2015-01-16 ENCOUNTER — Emergency Department (HOSPITAL_COMMUNITY): Payer: Self-pay

## 2015-01-16 ENCOUNTER — Emergency Department (HOSPITAL_COMMUNITY)
Admission: EM | Admit: 2015-01-16 | Discharge: 2015-01-16 | Disposition: A | Discharge status: Home / Self Care | Payer: Self-pay | Attending: Emergency Medicine | Admitting: Emergency Medicine

## 2015-01-16 DIAGNOSIS — Z720 Tobacco use: Secondary | ICD-10-CM | POA: Insufficient documentation

## 2015-01-16 DIAGNOSIS — J069 Acute upper respiratory infection, unspecified: Secondary | ICD-10-CM | POA: Insufficient documentation

## 2015-01-16 DIAGNOSIS — Z86011 Personal history of benign neoplasm of the brain: Secondary | ICD-10-CM | POA: Insufficient documentation

## 2015-01-16 LAB — RAPID STREP SCREEN (MED CTR MEBANE ONLY): Streptococcus, Group A Screen (Direct): NEGATIVE

## 2015-01-16 MED ORDER — HYDROCOD POLST-CHLORPHEN POLST 10-8 MG/5ML PO LQCR
5.0000 mL | Freq: Once | ORAL | Status: AC
  Administered 2015-01-16: 5 mL via ORAL
  Filled 2015-01-16: qty 5

## 2015-01-16 MED ORDER — IPRATROPIUM-ALBUTEROL 0.5-2.5 (3) MG/3ML IN SOLN
3.0000 mL | Freq: Once | RESPIRATORY_TRACT | Status: AC
  Administered 2015-01-16: 3 mL via RESPIRATORY_TRACT
  Filled 2015-01-16: qty 3

## 2015-01-16 MED ORDER — HYDROCOD POLST-CHLORPHEN POLST 10-8 MG/5ML PO LQCR: 5.0000 mL | Freq: Two times a day (BID) | ORAL | Status: DC | PRN

## 2015-01-16 MED ORDER — ALBUTEROL SULFATE HFA 108 (90 BASE) MCG/ACT IN AERS
2.0000 | INHALATION_SPRAY | Freq: Once | RESPIRATORY_TRACT | Status: AC
  Administered 2015-01-16: 2 via RESPIRATORY_TRACT
  Filled 2015-01-16: qty 6.7

## 2015-01-16 NOTE — ED Notes (Signed)
Pt presents to department for evaluation of cough and sinus congestion. Onset today. Respirations unlabored. Pt is alert and oriented x4.

## 2015-01-16 NOTE — ED Provider Notes (Signed)
CSN: [id number]     Arrival date & time 01/16/15  6063 History   First MD Initiated Contact with Patient 01/16/15 1008     Chief Complaint  Patient presents with  . Nasal Congestion  . Cough     (Consider location/radiation/quality/duration/timing/severity/associated sxs/prior Treatment) The history is provided by the patient.     Patient presents with cough, SOB, sore throat, nasal congestion that began yesterday.  Fevers to 101 at home.  Cough is productive of thick brown sputum with streaks of blood.  States her girlfriend also got sick yesterday and is being treated for strep throat.  Pt is a smoker.  No recent immobilization or leg swelling, no exogenous estrogen.  Past Medical History  Diagnosis Date  . Seizures   . Brain tumor    History reviewed. No pertinent past surgical history. No family history on file. History  Substance Use Topics  . Smoking status: Current Every Day Smoker -- 0.50 packs/day    Types: Cigarettes  . Smokeless tobacco: Not on file  . Alcohol Use: Yes     Comment: socially   OB History    No data available     Review of Systems  All other systems reviewed and are negative.     Allergies  Review of patient's allergies indicates no known allergies.  Home Medications   Prior to Admission medications   Medication Sig Start Date End Date Taking? Authorizing Provider  aspirin-acetaminophen-caffeine (EXCEDRIN MIGRAINE) [phone] MG per tablet Take 2 tablets by mouth every 6 (six) hours as needed for headache.    Historical Provider, MD   BP 100/61 mmHg  Pulse 81  Temp(Src) 97.9 F (36.6 C) (Oral)  Resp 18  SpO2 99%  LMP 12/23/2014 Physical Exam  Constitutional: She appears well-developed and well-nourished. No distress.  HENT:  Head: Normocephalic and atraumatic.  Mouth/Throat: Uvula is midline. Mucous membranes are not dry. No uvula swelling. Posterior oropharyngeal erythema present. No oropharyngeal exudate, posterior oropharyngeal  edema or tonsillar abscesses.  +nasal congestion  Eyes: Conjunctivae are normal.  Neck: Normal range of motion. Neck supple.  Cardiovascular: Normal rate and regular rhythm.   Pulmonary/Chest: Effort normal and breath sounds normal. No stridor. No respiratory distress. She has no wheezes. She has no rales.  Abdominal: Soft. She exhibits no distension. There is no tenderness. There is no rebound and no guarding.  Musculoskeletal:  Bilateral lower extremities without edema or tenderness  Lymphadenopathy:    She has no cervical adenopathy.  Neurological: She is alert.  Skin: She is not diaphoretic.  Nursing note and vitals reviewed.   ED Course  Procedures (including critical care time) Labs Review Labs Reviewed  RAPID STREP SCREEN  CULTURE, GROUP A STREP    Imaging Review Dg Chest 2 View  01/16/2015   CLINICAL DATA:  Shortness of breath.  Fever  EXAM: CHEST  2 VIEW  COMPARISON:  June 21, 2013  FINDINGS: Lungs are clear. Heart size and pulmonary vascularity are normal. No adenopathy. No pneumothorax. No bone lesions.  IMPRESSION: No edema or consolidation.   Electronically Signed   By: Lowella Grip M.D.   On: 01/16/2015 11:33     EKG Interpretation None      MDM   Final diagnoses:  URI (upper respiratory infection)    Afebrile, nontoxic patient with constellation of symptoms suggestive of viral syndrome.  No concerning findings on exam.  CXR, strep screen negative.  Discharged home with supportive care, PCP follow up.  Discussed result,  findings, treatment, and follow up  with patient.  Pt given return precautions.  Pt verbalizes understanding and agrees with plan.       Clayton Bibles, PA-C 01/16/15 [place], MD 01/17/15 [phone]

## 2015-01-16 NOTE — Discharge Instructions (Signed)
Read the information below.  Use the prescribed medication as directed.  Please discuss all new medications with your pharmacist.  You may return to the Emergency Department at any time for worsening condition or any new symptoms that concern you.  If you develop high fevers that do not resolve with tylenol or ibuprofen, you have difficulty swallowing or breathing, or you are unable to tolerate fluids by mouth, return to the ER for a recheck.    ° ° °Upper Respiratory Infection, Adult °An upper respiratory infection (URI) is also known as the common cold. It is often caused by a type of germ (virus). Colds are easily spread (contagious). You can pass it to others by kissing, coughing, sneezing, or drinking out of the same glass. Usually, you get better in 1 or 2 weeks.  °HOME CARE  °· Only take medicine as told by your doctor. °· Use a warm mist humidifier or breathe in steam from a hot shower. °· Drink enough water and fluids to keep your pee (urine) clear or pale yellow. °· Get plenty of rest. °· Return to work when your temperature is back to normal or as told by your doctor. You may use a face mask and wash your hands to stop your cold from spreading. °GET HELP RIGHT AWAY IF:  °· After the first few days, you feel you are getting worse. °· You have questions about your medicine. °· You have chills, shortness of breath, or [place] or red spit (mucus). °· You have yellow or [place] snot (nasal discharge) or pain in the face, especially when you bend forward. °· You have a fever, puffy (swollen) neck, pain when you swallow, or white spots in the back of your throat. °· You have a bad headache, ear pain, sinus pain, or chest pain. °· You have a high-pitched whistling sound when you breathe in and out (wheezing). °· You have a lasting cough or cough up blood. °· You have sore muscles or a stiff neck. °MAKE SURE YOU:  °· Understand these instructions. °· Will watch your condition. °· Will get help right away if you are  not doing well or get worse. °Document Released: 05/18/2008 Document Revised: 02/22/2012 Document Reviewed: 03/07/2014 °ExitCare® Patient Information ©2015 [employer]. This information is not intended to replace advice given to you by your health care provider. Make sure you discuss any questions you have with your health care provider. ° °Viral Infections °A virus is a type of germ. Viruses can cause: °· Minor sore throats. °· Aches and pains. °· Headaches. °· Runny nose. °· Rashes. °· Watery eyes. °· Tiredness. °· Coughs. °· Loss of appetite. °· Feeling sick to your stomach (nausea). °· Throwing up (vomiting). °· Watery poop (diarrhea). °HOME CARE  °· Only take medicines as told by your doctor. °· Drink enough water and fluids to keep your pee (urine) clear or pale yellow. Sports drinks are a good choice. °· Get plenty of rest and eat healthy. Soups and broths with crackers or rice are fine. °GET HELP RIGHT AWAY IF:  °· You have a very bad headache. °· You have shortness of breath. °· You have chest pain or neck pain. °· You have an unusual rash. °· You cannot stop throwing up. °· You have watery poop that does not stop. °· You cannot keep fluids down. °· You or your child has a temperature by mouth above 102° F (38.9° C), not controlled by medicine. °· Your baby is older than 3   months with a rectal temperature of 102 F (38.9 C) or higher.  Your baby is 69 months old or younger with a rectal temperature of 100.4 F (38 C) or higher. MAKE SURE YOU:   Understand these instructions.  Will watch this condition.  Will get help right away if you are not doing well or get worse. Document Released: 11/12/2008 Document Revised: 02/22/2012 Document Reviewed: 04/07/2011 [facility] Patient Information 2015 [place], [place]. This information is not intended to replace advice given to you by your health care provider. Make sure you discuss any questions you have with your health care provider.    Emergency  Department Resource Guide 1) Find a Doctor and Pay Out of Pocket Although you won't have to find out who is covered by your insurance plan, it is a good idea to ask around and get recommendations. You will then need to call the office and see if the doctor you have chosen will accept you as a new patient and what types of options they offer for patients who are self-pay. Some doctors offer discounts or will set up payment plans for their patients who do not have insurance, but you will need to ask so you aren't surprised when you get to your appointment.  2) Contact Your Local Health Department Not all health departments have doctors that can see patients for sick visits, but many do, so it is worth a call to see if yours does. If you don't know where your local health department is, you can check in your phone book. The CDC also has a tool to help you locate your state's health department, and many state websites also have listings of all of their local health departments.  3) Find a [facility] If your illness is not likely to be very severe or complicated, you may want to try a walk in clinic. These are popping up all over the country in pharmacies, drugstores, and shopping centers. They're usually staffed by nurse practitioners or physician assistants that have been trained to treat common illnesses and complaints. They're usually fairly quick and inexpensive. However, if you have serious medical issues or chronic medical problems, these are probably not your best option.  No Primary Care Doctor: - Call Health Connect at  [phone] - they can help you locate a primary care doctor that  accepts your insurance, provides certain services, etc. - Physician Referral Service- [phone]  Chronic Pain Problems: Organization         Address  Phone   Notes  [facility]  [phone] Patients need to be referred by their primary care doctor.   Medication  Assistance: Organization         Address  Phone   Notes  [facility] Medication [facility] [address] [place], [place] [address] [phone] --Must be a resident of [facility] -- Must have NO insurance coverage whatsoever (no Medicaid/ Medicare, etc.) -- The pt. MUST have a primary care doctor that directs their care regularly and follows them in the community   MedAssist  [phone]   [employer]  [phone]    Agencies that provide inexpensive medical care: Organization         Address  Phone   Notes  [place]  [phone]   [facility]    [phone]   [facility] [place], [place] [address] [phone]  [facility] [address] [place] [phone]   Planned Parenthood    [phone]   [facility]    [phone]   [place] and [place] [address], [place] Phone:  [phone], Fax:  [phone] Hours of Operation:  9 am - 6 pm, M-F.  Also accepts Medicaid/Medicare and self-pay.  [facility] for [place] [address], [address], [place] Phone: [phone], Fax: [phone]. Hours of Operation:  8:30 am - 5:30 pm, M-F.  Also accepts Medicaid and self-pay.  [facility] [place] [address], [place] Phone: [phone]   [place], [place], [place] [phone], Ext. [phone] Mondays & Thursdays: 7-9 AM.  First 15 patients are seen on a first come, first serve basis.    [place] Providers:  Organization         Address  Phone   Notes  [facility] [address], [place] [phone] Also accepts self-pay patients.  [facility] [address], [place]  [phone]   [place], [address], [place]  [phone]   [facility] [address] [place] [phone]   Lucianne Lei [address], [place]   [phone] Only accepts [place] Access [place] patients after they have their name applied to their card.   Self-Pay (no insurance) in [facility]:  Organization         Address  Phone   Notes  Sickle Cell Patients, [facility] [place] [phone]   [facility] Urgent Care [place] [phone]   [facility] [place]  [place], [place], [place] [phone]   [facility]/Dr. Osei-Bonsu  [address], [place] or [address], [place] [phone] Phone number for both [place] Longie [place] and [place] locations is the same.  Urgent Medical and [facility] [address] [place] [phone]   [facility] [address], [place] or [address] [phone]   [facility] [address] [place] [phone], phone; [phone], fax Sees patients 1st and 3rd Saturday of every month.  Must not qualify for public or private insurance (i.e. Medicaid, Medicare, [place] Health Choice, Veterans' Benefits)  Household income should be no more than 200% of the poverty level The clinic cannot treat you if you are pregnant or think you are pregnant  Sexually transmitted diseases are not treated at the clinic.    Dental Care: Organization         Address  Phone  Notes  [facility] Department of [facility] [place] [phone] Accepts children up to age 53 who are enrolled in [place] or [place]; pregnant women with a Medicaid card; and children who have applied for Medicaid or  Health Choice, but were declined, whose parents can pay a reduced fee at time of service.  [facility]  Department of [facility]  [address], [place] [phone] Accepts children up to age 71 who are enrolled in [place] or [place]; pregnant women with a Medicaid card; and  children who have applied for Medicaid or [place] Health Choice, but were declined, whose parents can pay a reduced fee at time of service.  [facility] Adult [facility] Access PROGRAM  [place] [phone] Patients are seen by appointment only. Walk-ins are not accepted. [place] will see patients 25 years of age and older. Monday - Tuesday (8am-5pm) Most Wednesdays (8:30-5pm) $30 per visit, cash only  [facility] Adult Dental Access PROGRAM  [address], [facility] [phone] Patients are seen by appointment only. Walk-ins are not accepted. [place] will see patients 18 years of age and older. One Wednesday Evening (Monthly: Volunteer Based).  $30 per visit, cash only  [place]  [phone] for adults; Children under age 49, call Graduate Pediatric Dentistry at [phone]. Children aged 66-14, please call [phone] to request a pediatric application.  Dental services are provided in all areas of dental care including fillings, crowns and bridges, complete and partial dentures, implants, gum treatment, root canals, and extractions. Preventive care is also provided. Treatment is provided to both adults and children. Patients are selected via a lottery and there is often a waiting list.   [facility] [address] [place]  [phone] [email or web address]   [facility] [address] [place], [place] [phone], Ext. [phone] Second and Fourth Thursday of each month, opens at 6:30 AM; Clinic ends at 9 AM.  Patients are seen on a first-come first-served basis, and a limited number are seen during each clinic.   [facility]  [address] Hillard Danker [place], [place] [phone]    Eligibility Requirements You must have lived in [place], [place], or [place] counties for at least the last three months.   You cannot be eligible for state or federal sponsored [employer], including [employer], [place], or [employer].   You generally cannot be eligible for healthcare insurance through your employer.    How to apply: Eligibility screenings are held every Tuesday and Wednesday afternoon from 1:00 pm until 4:00 pm. You do not need an appointment for the interview!  [facility] [address] [place], [place]   [place]  [place] Department  [place]  [phone]    Behavioral Health Resources in the Community: Intensive Outpatient Programs Organization         Address  Phone  Notes  [place]. [address] [place], [place] [phone]   [facility] Outpatient [address], [place], [place]   ADS: Alcohol & Drug Svcs [address], [place], [place]   [place] [address],  [place], [place] or [phone]   Substance Abuse Resources Organization         Address  Phone  Notes  Alcohol and Drug Services  [phone]   [place]  [phone]   [facility] [place]   Chinita Pester  [phone]   Residential & Outpatient Substance Abuse Program  [phone]   Psychological Services Organization         Address  Phone  Notes  [facility] [place]  [place]  [phone]   [place] [address], [place] or [phone]    Mobile Crisis Teams Organization  Address  Phone  Notes  [facility], Mobile Crisis Care Unit  [phone]   Assertive Psychotherapeutic Services  [address]  [place], [place]   [facility] [address] [place] [phone]    Self-Help/Support Groups Organization         Address  Phone             Notes  [place]. of [place] - variety of support groups  [place] Call for more information  Narcotics Anonymous (NA), [facility] [address], [employer] [place]  2 meetings at this location   Special educational needs teacher         Address  Phone  Notes  ASAP Residential Treatment [place],    [place]  [phone]   [facility]  [address], [place] [id number], [place], [place]   [place], [place] [phone] Admissions: 8am-3pm M-F  Incentives Substance [place] [address]    [place], [place] [phone]   [facility] [address] [place], [place], [place]   [facility] [address],  [place], [place]   [facility]ight Programs - Intensive Outpatient [address] Kristeen [address], [place], [place]   [facility] ([place].) [address].,  [place], [place] [phone] or [phone]   [facility] Services (RTS) [address] [place], [place] Accepts Medicaid  [facility] [place] [address],  [place] [phone] Substance Abuse/Addiction Treatment   [facility] Organization         Address  Phone  Notes  [facility]  [phone]   Domenic Schwab, PhD [address] Arlis [place], [place]   [phone] or [phone]   [place], [place] [phone]   [facility] [address], [place], [place] [phone] Insurance/Medicaid/sponsorship through [facility] and Families [address], [address] [place]                                    [place], [place] [phone] [place] [address], [place] [phone]    Dr. Adele Schilder  [phone]   [facility] Dept. 1) [address], [place] 2) [place] 3)  [place] [address], [place] [phone]  [phone]   [place] [phone] or [phone] (After Hours)

## 2015-01-18 LAB — CULTURE, GROUP A STREP

## 2015-03-25 ENCOUNTER — Emergency Department (HOSPITAL_COMMUNITY): Payer: Self-pay

## 2015-03-25 ENCOUNTER — Emergency Department (HOSPITAL_COMMUNITY)
Admission: EM | Admit: 2015-03-25 | Discharge: 2015-03-25 | Disposition: A | Discharge status: Home / Self Care | Payer: Self-pay | Attending: Emergency Medicine | Admitting: Emergency Medicine

## 2015-03-25 ENCOUNTER — Encounter (HOSPITAL_COMMUNITY): Payer: Self-pay | Admitting: Emergency Medicine

## 2015-03-25 DIAGNOSIS — Z85841 Personal history of malignant neoplasm of brain: Secondary | ICD-10-CM | POA: Insufficient documentation

## 2015-03-25 DIAGNOSIS — Z720 Tobacco use: Secondary | ICD-10-CM | POA: Insufficient documentation

## 2015-03-25 DIAGNOSIS — M25572 Pain in left ankle and joints of left foot: Secondary | ICD-10-CM | POA: Insufficient documentation

## 2015-03-25 MED ORDER — IBUPROFEN 600 MG PO TABS: 600.0000 mg | ORAL_TABLET | Freq: Four times a day (QID) | ORAL | Status: DC | PRN

## 2015-03-25 MED ORDER — KETOROLAC TROMETHAMINE 30 MG/ML IJ SOLN
30.0000 mg | Freq: Once | INTRAMUSCULAR | Status: AC
  Administered 2015-03-25: 30 mg via INTRAMUSCULAR
  Filled 2015-03-25: qty 1

## 2015-03-25 NOTE — ED Provider Notes (Signed)
CSN: [id number]     Arrival date & time 03/25/15  1115 History   First MD Initiated Contact with Patient 03/25/15 1347     Chief Complaint  Patient presents with  . Seizures  . Leg Pain     (Consider location/radiation/quality/duration/timing/severity/associated sxs/prior Treatment) HPI Comments: Patients with questionable history of seizure disorder, normal EEG in 2008, not on antiepileptics, currently unable to see neurologist because of insurance problems presents to the ED for left ankle pain that occurred after having a seizure this morning. She was in bed when the seizure activity started, witnessed, lasted only a few second with generalized shaking all over. Per witness patient seemed confused for a second or two after awakening. She is unsure how she injured her left ankle, but has noticed some swelling. Her pain is worse with ambulation and palpation. She has not followed up with neurology as advised, states she lost the paperwork from her last ED visit with the numbers.   Patient is a 24 y.o. female presenting with seizures and leg pain.  Seizures Leg Pain   Past Medical History  Diagnosis Date  . Seizures   . Brain tumor    History reviewed. No pertinent past surgical history. History reviewed. No pertinent family history. History  Substance Use Topics  . Smoking status: Current Every Day Smoker -- 0.50 packs/day    Types: Cigarettes  . Smokeless tobacco: Not on file  . Alcohol Use: Yes     Comment: socially   OB History    No data available     Review of Systems  Musculoskeletal: Positive for myalgias, joint swelling and arthralgias.  Neurological: Positive for seizures.  All other systems reviewed and are negative.     Allergies  Banana; Chocolate; Kiwi extract; Orange fruit; and Other  Home Medications   Prior to Admission medications   Medication Sig Start Date End Date Taking? Authorizing Provider  aspirin-acetaminophen-caffeine (EXCEDRIN MIGRAINE)  [phone] MG per tablet Take 2 tablets by mouth every 6 (six) hours as needed for headache.    Historical Provider, MD  chlorpheniramine-HYDROcodone (TUSSIONEX PENNKINETIC ER) 10-8 MG/5ML LQCR Take 5 mLs by mouth every 12 (twelve) hours as needed for cough (and pain). 01/16/15   Clayton Bibles, PA-C  ibuprofen (ADVIL,MOTRIN) 600 MG tablet Take 1 tablet (600 mg total) by mouth every 6 (six) hours as needed. 03/25/15   Coley Kulikowski, PA-C   BP 98/55 mmHg  Pulse 72  Temp(Src) 98.4 F (36.9 C) (Oral)  Resp 16  SpO2 100%  LMP 03/11/2015 Physical Exam  Constitutional: She is oriented to person, place, and time. She appears well-developed and well-nourished. No distress.  HENT:  Head: Normocephalic and atraumatic.  Right Ear: External ear normal.  Left Ear: External ear normal.  Nose: Nose normal.  Mouth/Throat: Oropharynx is clear and moist. No oropharyngeal exudate.  Eyes: Conjunctivae and EOM are normal. Pupils are equal, round, and reactive to light.  Neck: Normal range of motion. Neck supple.  Cardiovascular: Normal rate, regular rhythm, normal heart sounds and intact distal pulses.   Pulmonary/Chest: Effort normal and breath sounds normal. No respiratory distress.  Abdominal: Soft. There is no tenderness.  Musculoskeletal:       Right ankle: Normal.       Left ankle: Tenderness.       Right lower leg: Normal.       Left lower leg: Normal.       Right foot: There is tenderness and swelling.  Left foot: Normal.       Feet:  Neurological: She is alert and oriented to person, place, and time. She has normal strength. No cranial nerve deficit. Gait normal. GCS eye subscore is 4. GCS verbal subscore is 5. GCS motor subscore is 6.  Sensation grossly intact.  No pronator drift.  Bilateral heel-knee-shin intact.  Skin: Skin is warm and dry. She is not diaphoretic.  Nursing note and vitals reviewed.   ED Course  Procedures (including critical care time) Medications  ketorolac  (TORADOL) 30 MG/ML injection 30 mg (30 mg Intramuscular Given 03/25/15 1417)    Labs Review Labs Reviewed - No data to display  Imaging Review Dg Ankle Complete Left  03/25/2015   CLINICAL DATA:  Seizure last p.m., medial left ankle pain and stiffness  EXAM: LEFT ANKLE COMPLETE - 3+ VIEW  COMPARISON:  12/16/2011  FINDINGS: Three views of the left ankle submitted. No acute fracture or subluxation. Ankle mortise is preserved.  IMPRESSION: Negative.   Electronically Signed   By: Lahoma Crocker M.D.   On: 03/25/2015 12:25     EKG Interpretation None      MDM   Final diagnoses:  Left ankle pain    Filed Vitals:   03/25/15 1500  BP: 98/55  Pulse: 72  Temp:   Resp:    Afebrile, NAD, non-toxic appearing, AAOx4.   1) Seizures: patient presenting to the ED after a seizure, history of similar seizures, not on medication. Recent negative work up for seizure like activity. Patient needs to see neurology as an outpatient for further evaluation.   2) Ankle pain: Neurovascularly intact. Normal sensation. No evidence of compartment syndrome.Patient X-Ray negative for obvious fracture or dislocation. Pain managed in ED. Pt advised to follow up with PCP if symptoms persist for possibility of missed fracture diagnosis. Patient given brace while in ED, conservative therapy recommended and discussed. Patient will be dc home & is agreeable with above plan.   Patient d/w with Dr. Wilson Singer, agrees with plan.    Baron Sane, PA-C 03/25/15 [place], MD 03/25/15 1550

## 2015-03-25 NOTE — Discharge Instructions (Signed)
Please follow up with your primary care physician in 1-2 days. If you do not have one please call the [facility] number listed above. Please follow up with a neurologist to schedule a follow up appointment.  Please read all discharge instructions and return precautions.    Ankle Pain Ankle pain is a common symptom. The bones, cartilage, tendons, and muscles of the ankle joint perform a lot of work each day. The ankle joint holds your body weight and allows you to move around. Ankle pain can occur on either side or back of 1 or both ankles. Ankle pain may be sharp and burning or dull and aching. There may be tenderness, stiffness, redness, or warmth around the ankle. The pain occurs more often when a person walks or puts pressure on the ankle. CAUSES  There are many reasons ankle pain can develop. It is important to work with your caregiver to identify the cause since many conditions can impact the bones, cartilage, muscles, and tendons. Causes for ankle pain include:  Injury, including a break (fracture), sprain, or strain often due to a fall, sports, or a high-impact activity.  Swelling (inflammation) of a tendon (tendonitis).  Achilles tendon rupture.  Ankle instability after repeated sprains and strains.  Poor foot alignment.  Pressure on a nerve (tarsal tunnel syndrome).  Arthritis in the ankle or the lining of the ankle.  Crystal formation in the ankle (gout or pseudogout). DIAGNOSIS  A diagnosis is based on your medical history, your symptoms, results of your physical exam, and results of diagnostic tests. Diagnostic tests may include X-ray exams or a computerized magnetic scan (magnetic resonance imaging, MRI). TREATMENT  Treatment will depend on the cause of your ankle pain and may include:  Keeping pressure off the ankle and limiting activities.  Using crutches or other walking support (a cane or brace).  Using rest, ice, compression, and  elevation.  Participating in physical therapy or home exercises.  Wearing shoe inserts or special shoes.  Losing weight.  Taking medications to reduce pain or swelling or receiving an injection.  Undergoing surgery. HOME CARE INSTRUCTIONS   Only take over-the-counter or prescription medicines for pain, discomfort, or fever as directed by your caregiver.  Put ice on the injured area.  Put ice in a plastic bag.  Place a towel between your skin and the bag.  Leave the ice on for 15-20 minutes at a time, 03-04 times a day.  Keep your leg raised (elevated) when possible to lessen swelling.  Avoid activities that cause ankle pain.  Follow specific exercises as directed by your caregiver.  Record how often you have ankle pain, the location of the pain, and what it feels like. This information may be helpful to you and your caregiver.  Ask your caregiver about returning to work or sports and whether you should drive.  Follow up with your caregiver for further examination, therapy, or testing as directed. SEEK MEDICAL CARE IF:   Pain or swelling continues or worsens beyond 1 week.  You have an oral temperature above 102 F (38.9 C).  You are feeling unwell or have chills.  You are having an increasingly difficult time with walking.  You have loss of sensation or other new symptoms.  You have questions or concerns. MAKE SURE YOU:   Understand these instructions.  Will watch your condition.  Will get help right away if you are not doing well or get worse. Document Released: 05/20/2010 Document Revised: 02/22/2012  Document Reviewed: 05/20/2010 [facility] Patient Information 2015 [place]. This information is not intended to replace advice given to you by your health care provider. Make sure you discuss any questions you have with your health care provider.

## 2015-03-25 NOTE — ED Notes (Signed)
Per family pt had 2 seizure today with hx of same not on meds; pt sts now having pain in left foot; pt denies injuring during seizure and did not fall

## 2015-04-03 ENCOUNTER — Emergency Department (HOSPITAL_COMMUNITY)
Admission: EM | Admit: 2015-04-03 | Discharge: 2015-04-03 | Disposition: A | Discharge status: Home / Self Care | Payer: Self-pay | Attending: Emergency Medicine | Admitting: Emergency Medicine

## 2015-04-03 DIAGNOSIS — R569 Unspecified convulsions: Secondary | ICD-10-CM | POA: Insufficient documentation

## 2015-04-03 DIAGNOSIS — Z720 Tobacco use: Secondary | ICD-10-CM | POA: Insufficient documentation

## 2015-04-03 DIAGNOSIS — R51 Headache: Secondary | ICD-10-CM | POA: Insufficient documentation

## 2015-04-03 DIAGNOSIS — R110 Nausea: Secondary | ICD-10-CM | POA: Insufficient documentation

## 2015-04-03 DIAGNOSIS — Z86011 Personal history of benign neoplasm of the brain: Secondary | ICD-10-CM | POA: Insufficient documentation

## 2015-04-03 LAB — BASIC METABOLIC PANEL
ANION GAP: 7 (ref 5–15)
BUN: 9 mg/dL (ref 6–23)
CALCIUM: 8.7 mg/dL (ref 8.4–10.5)
CHLORIDE: 109 mmol/L (ref 96–112)
CO2: 23 mmol/L (ref 19–32)
Creatinine, Ser: 0.59 mg/dL (ref 0.50–1.10)
GFR calc Af Amer: >90 mL/min (ref >90)
Glucose, Bld: 80 mg/dL (ref 70–99)
Potassium: 3.5 mmol/L (ref 3.5–5.1)
Sodium: 139 mmol/L (ref 135–145)

## 2015-04-03 LAB — CBC WITH DIFFERENTIAL/PLATELET
Basophils Absolute: 0 10*3/uL (ref 0.0–0.1)
Basophils Relative: 1 % (ref 0–1)
Eosinophils Absolute: 0.2 10*3/uL (ref 0.0–0.7)
Eosinophils Relative: 3 % (ref 0–5)
HEMATOCRIT: 37.9 % (ref 36.0–46.0)
Hemoglobin: 12.5 g/dL (ref 12.0–15.0)
Lymphocytes Relative: 39 % (ref 12–46)
Lymphs Abs: 2.8 10*3/uL (ref 0.7–4.0)
MCH: 30.6 pg (ref 26.0–34.0)
MCHC: 33 g/dL (ref 30.0–36.0)
MCV: 92.7 fL (ref 78.0–100.0)
MONO ABS: 0.4 10*3/uL (ref 0.1–1.0)
Monocytes Relative: 6 % (ref 3–12)
NEUTROS ABS: 3.6 10*3/uL (ref 1.7–7.7)
Neutrophils Relative %: 51 % (ref 43–77)
Platelets: 248 10*3/uL (ref 150–400)
RBC: 4.09 MIL/uL (ref 3.87–5.11)
RDW: 12.1 % (ref 11.5–15.5)
WBC: 7.1 10*3/uL (ref 4.0–10.5)

## 2015-04-03 MED ORDER — IBUPROFEN 200 MG PO TABS
600.0000 mg | ORAL_TABLET | Freq: Once | ORAL | Status: AC
  Administered 2015-04-03: 600 mg via ORAL
  Filled 2015-04-03: qty 3

## 2015-04-03 MED ORDER — ONDANSETRON 4 MG PO TBDP
4.0000 mg | ORAL_TABLET | Freq: Once | ORAL | Status: AC
  Administered 2015-04-03: 4 mg via ORAL
  Filled 2015-04-03: qty 1

## 2015-04-03 MED ORDER — DIVALPROEX SODIUM 125 MG PO DR TAB: 125.0000 mg | DELAYED_RELEASE_TABLET | Freq: Every day | ORAL | Status: DC

## 2015-04-03 NOTE — ED Notes (Signed)
Per EMS: Pt was at work and went to bathroom. When co-worker went in the bathroom to check on her pt found on the floor having a seizure. Pt has hx of seizures since 9th grade when she was diagnosed with a brain tumor, for which she has had 5 surgeries. Pt takes depakote, but states she only has two pills left and doesn't know when she can get more, so she's been cutting pills in half. Pt post-ictal upon EMS arrival. A&O x 4 upon arrival in ED. Pt c/o pain to [facility] [place]. Denies neck or back pain.

## 2015-04-03 NOTE — ED Provider Notes (Signed)
CSN: [id number]     Arrival date & time 04/03/15  1700 History   First MD Initiated Contact with Patient 04/03/15 1706     Chief Complaint  Patient presents with  . Seizures     (Consider location/radiation/quality/duration/timing/severity/associated sxs/prior Treatment) HPI  24 year old female presents with a seizure at work. She states a couple hours ago she was telling coworker she was not feeling well but they would not give her a break. When she finally operation went to the bathroom and was found on the floor having a seizure. She was postictal and now has returned to normal. She states this is a typical seizure and this is typically how she feels before she gets a seizure. Otherwise has not been ill recently, no headaches prior to the seizure, vomiting, or focal weakness. Mild headache and nausea which is typical after a seizure. She states she used to be on Depakote and Ativan that seemed to control her seizures. She lost her insurance and has not been able to get insurance for the past 1 year and has not had meds since then. She thinks she used to be on a "low dose" of Depakote but does not know the exact dose.   Past Medical History  Diagnosis Date  . Seizures   . Brain tumor    No past surgical history on file. No family history on file. History  Substance Use Topics  . Smoking status: Current Every Day Smoker -- 0.50 packs/day    Types: Cigarettes  . Smokeless tobacco: Not on file  . Alcohol Use: Yes     Comment: socially   OB History    No data available     Review of Systems  Constitutional: Negative for fever.  Gastrointestinal: Positive for nausea. Negative for vomiting.  Neurological: Positive for seizures and headaches. Negative for weakness and numbness.  All other systems reviewed and are negative.     Allergies  Banana; Chocolate; Kiwi extract; Orange fruit; and Other  Home Medications   Prior to Admission medications   Medication Sig Start Date End  Date Taking? Authorizing Provider  ibuprofen (ADVIL,MOTRIN) 600 MG tablet Take 1 tablet (600 mg total) by mouth every 6 (six) hours as needed. 03/25/15  Yes Jennifer Piepenbrink, PA-C  aspirin-acetaminophen-caffeine (EXCEDRIN MIGRAINE) [phone] MG per tablet Take 2 tablets by mouth every 6 (six) hours as needed for headache.    Historical Provider, MD  chlorpheniramine-HYDROcodone (TUSSIONEX PENNKINETIC ER) 10-8 MG/5ML LQCR Take 5 mLs by mouth every 12 (twelve) hours as needed for cough (and pain). 01/16/15   Clayton Bibles, PA-C   BP 107/66 mmHg  Pulse 72  Temp(Src) 98 F (36.7 C) (Oral)  Resp 18  SpO2 100%  LMP 03/11/2015 Physical Exam  Constitutional: She is oriented to person, place, and time. She appears well-developed and well-nourished.  HENT:  Head: Normocephalic and atraumatic.  Right Ear: External ear normal.  Left Ear: External ear normal.  Nose: Nose normal.  No tongue injury  Eyes: EOM are normal. Pupils are equal, round, and reactive to light. Right eye exhibits no discharge. Left eye exhibits no discharge.  Cardiovascular: Normal rate, regular rhythm and normal heart sounds.   Pulmonary/Chest: Effort normal and breath sounds normal.  Abdominal: Soft. There is no tenderness.  Neurological: She is alert and oriented to person, place, and time.  CN 2-12 grossly intact. 5/5/ strength in all 4 extremities. Grossly normal sensation  Skin: Skin is warm and dry.  Nursing note and vitals reviewed.  ED Course  Procedures (including critical care time) Labs Review Labs Reviewed  BASIC METABOLIC PANEL  CBC WITH DIFFERENTIAL/PLATELET    Imaging Review No results found.   EKG Interpretation None      MDM   Final diagnoses:  Seizure    Patient with a recurrent uncomplicated seizure. Initially postictal for EMS, now normal. Normal neuro exam. Labwork unremarkable. Used to be well controlled on "low dose depakote", will consult SW given no insurance for med assistance  and Rx depakote. Stable for discharge.     Sherwood Gambler, MD 04/04/15 0111

## 2015-04-03 NOTE — ED Notes (Signed)
Bed: WA09 Expected date:  Expected time:  Means of arrival:  Comments: seizure

## 2015-04-03 NOTE — Discharge Instructions (Signed)

## 2015-04-03 NOTE — Progress Notes (Signed)
24 yr old [place] self pay pt with aunt and female friend at bedside who are very supportive Pt was at work and went to bathroom. When co-worker went in the bathroom to check on her pt found on the floor having a seizure. Pt has hx of seizures since 9th grade when she was diagnosed with a brain tumor, for which she has had 5 surgeries. Pt takes depakote, but states she only has two pills left and doesn't know when she can get more, so she's been cutting pills in half. Pt post-ictal upon EMS arrival. Pt noted with 6 [facility] ED visits and no admissions in the last 6 months ED Cm consulted by EDP [place]. CM and EDP discussed d/c of pt on Depakote "lowest dose", 125 mg with encouragement for pt to get a self pay/uninsured pcp.   CM spoke with pt who confirms self pay [facility] resident with no pcp. CM discussed and provided written information for self pay pcps, importance of pcp for f/u care, www.needymeds.org, www.goodrx.com, discounted pharmacies and other [employer] such as [employer], affordable care act,  financial assistance, DSS and  health department  Reviewed resources for [employer] self pay pcps like [facility], family medicine at [address], [facility] family practice, general medical clinics, [facility] urgent care plus others, [place] med assist, medication resources, [facility] out patient pharmacies and housing Pt voiced understanding and appreciation of resources provided   Provided [facility] contact information Pt agreed to referral Cm completed referral and encouraged going to [facility] on wednesdays by 8 am to see [facility] staff for orange card Cm provided goodrx cost list for depakote 125 mg, 30 tabs Discuss cost of $6.18 at [place] and pt given discount coupon card for this cost for [facility].  Cm discussed pcp vs ED services and pcp vs EDP services.  Discuss caution of using alcoholic beverages with seizure medications, compliance with taking seizure medications and standard pcp f/u services to make  sure seizure medication in her system or if needs to be re adjusted.

## 2015-04-03 NOTE — ED Notes (Signed)
Patient verbalizes understanding of discharge instructions, prescription medications, home care and follow up care. Patient ambulatory out of department at this time. 

## 2016-01-06 IMAGING — CR DG CERVICAL SPINE COMPLETE 4+V
5 series · 5 of 5 positions shown · non-contrast
Comparison: 12/12/2014

CLINICAL DATA: Fall.  Seizures.  Neck pain.

EXAM:
CERVICAL SPINE  4+ VIEWS

[c-spine lat]
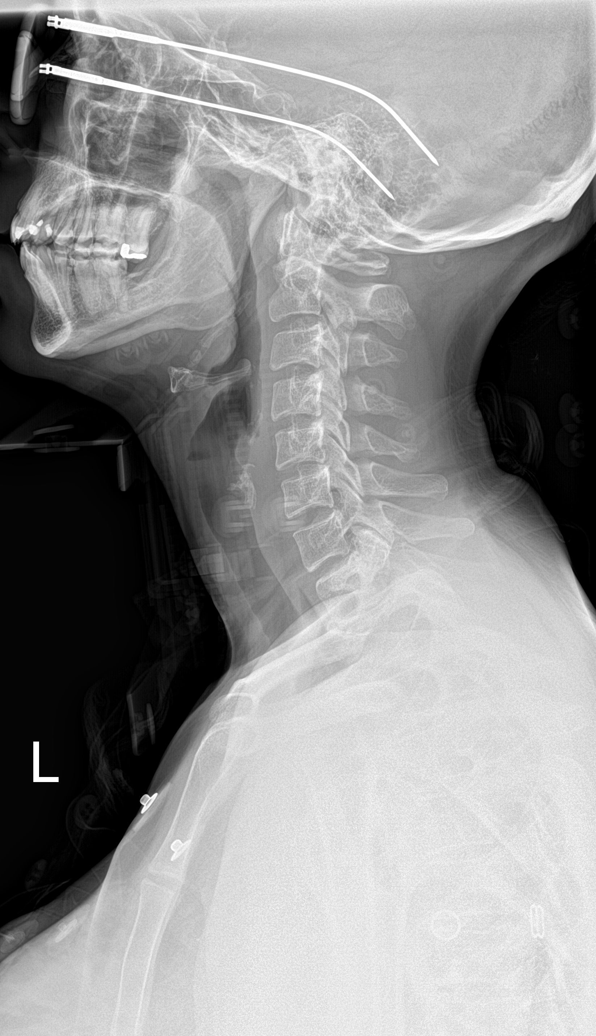

[c-spine obl (1 of 2)]
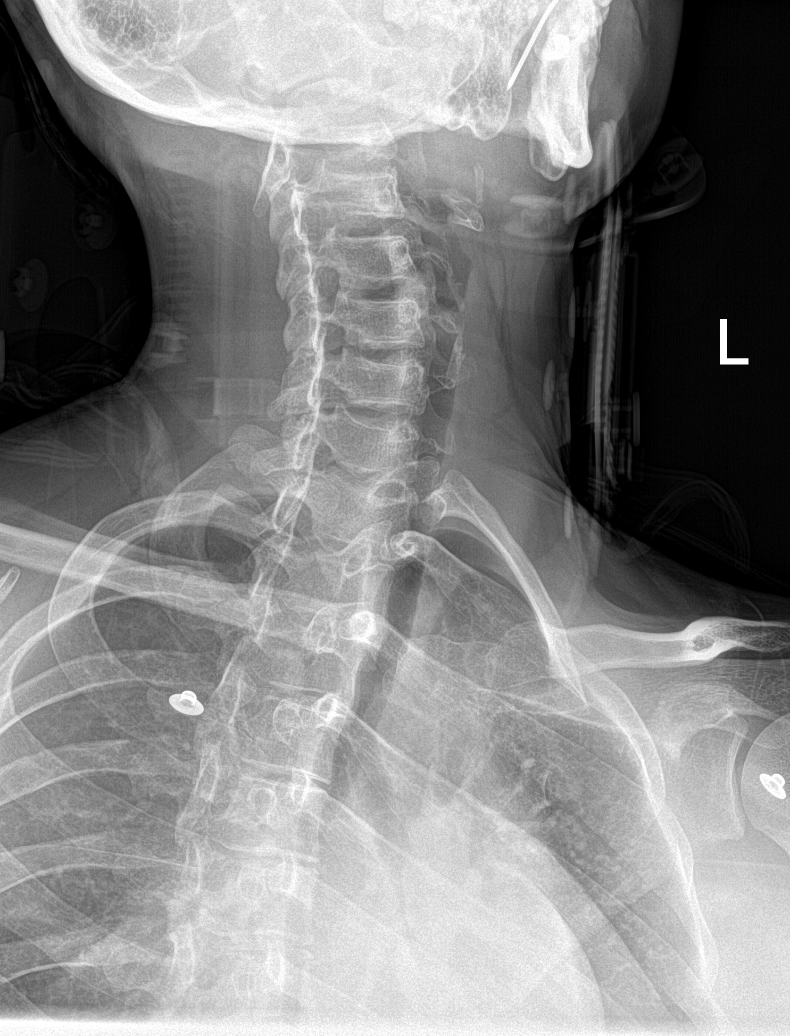

[c-spine obl (2 of 2)]
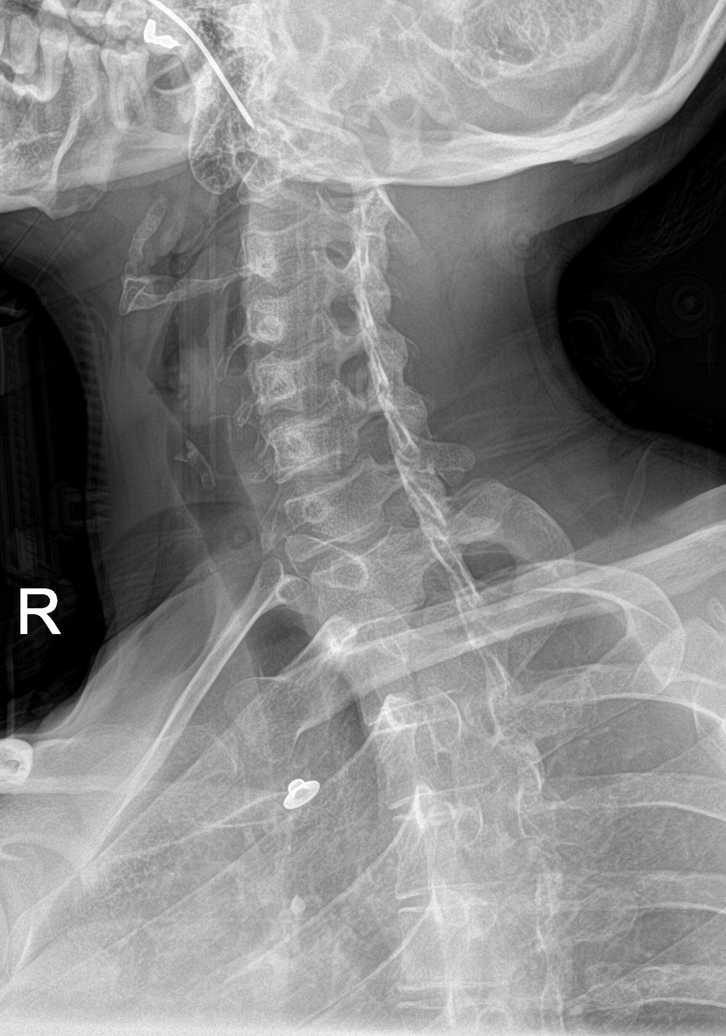

[c-spine ap]
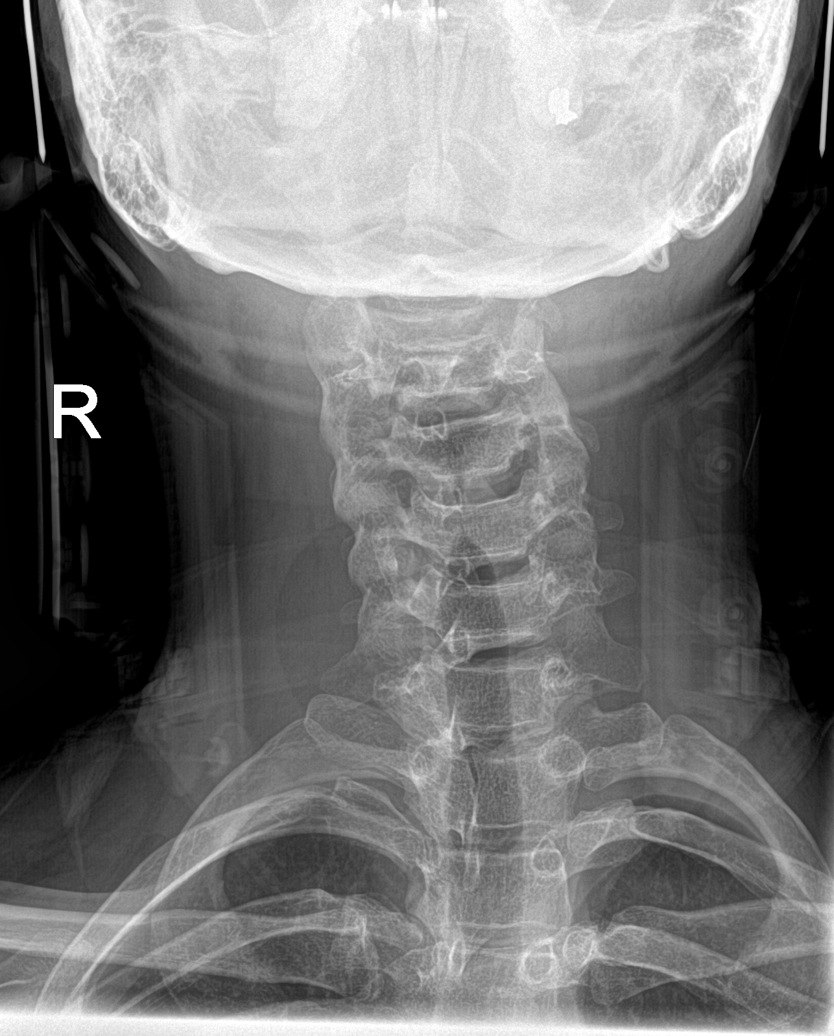

[c-spine open mouth]
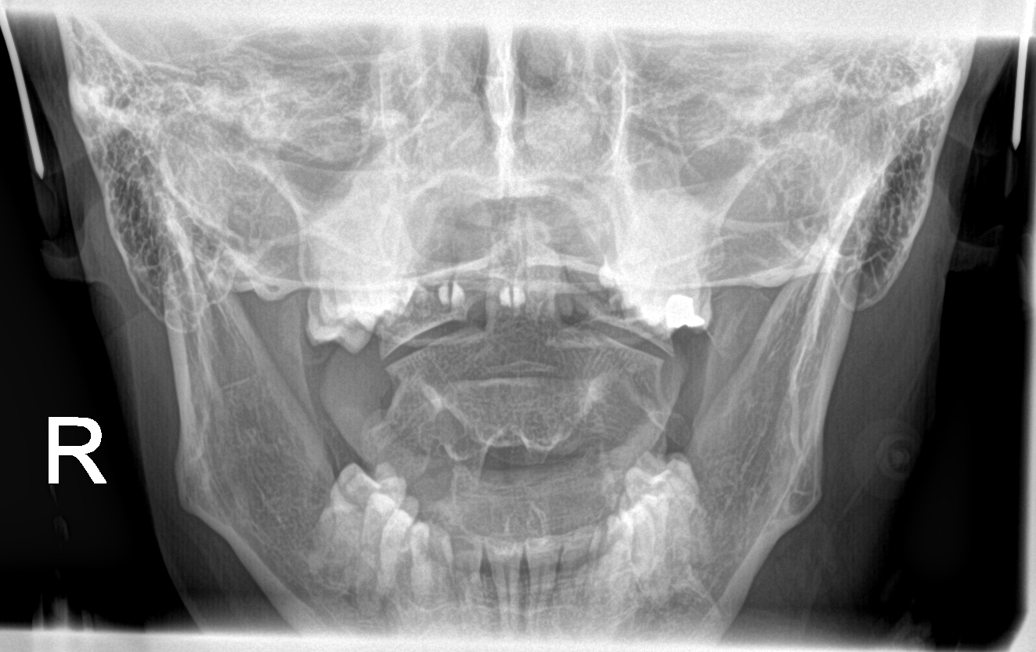

[5 of 5 positions shown; findings below may reference images not displayed]

FINDINGS: Imaging was performed with patient wearing a cervical collar.

Failure of fusion of the anterior and posterior arches of C1,
chronic.

No significant prevertebral soft tissue swelling. No malalignment.
No cervical fracture is identified. Stable dextroconvex upper
cervical scoliosis as shown on prior CT scan. Stable spina bifida
occulta at T1.

No cervical spine fracture or subluxation is identified.
IMPRESSION: 1. No cervical spine fracture or in collar static instability
identified.
2. Incidental note is made of lack of fusion of the anterior and
posterior arches of C1, in the posterior arch of T1. This is stable.

## 2016-01-19 ENCOUNTER — Emergency Department (HOSPITAL_COMMUNITY)
Admission: EM | Admit: 2016-01-19 | Discharge: 2016-01-20 | Disposition: A | Discharge status: Home / Self Care | Payer: Self-pay | Attending: Emergency Medicine | Admitting: Emergency Medicine

## 2016-01-19 ENCOUNTER — Encounter (HOSPITAL_COMMUNITY): Payer: Self-pay | Admitting: *Deleted

## 2016-01-19 DIAGNOSIS — F1210 Cannabis abuse, uncomplicated: Secondary | ICD-10-CM | POA: Insufficient documentation

## 2016-01-19 DIAGNOSIS — F0631 Mood disorder due to known physiological condition with depressive features: Secondary | ICD-10-CM

## 2016-01-19 DIAGNOSIS — Y998 Other external cause status: Secondary | ICD-10-CM | POA: Insufficient documentation

## 2016-01-19 DIAGNOSIS — X58XXXA Exposure to other specified factors, initial encounter: Secondary | ICD-10-CM | POA: Insufficient documentation

## 2016-01-19 DIAGNOSIS — F419 Anxiety disorder, unspecified: Secondary | ICD-10-CM | POA: Insufficient documentation

## 2016-01-19 DIAGNOSIS — F17210 Nicotine dependence, cigarettes, uncomplicated: Secondary | ICD-10-CM | POA: Insufficient documentation

## 2016-01-19 DIAGNOSIS — F1310 Sedative, hypnotic or anxiolytic abuse, uncomplicated: Secondary | ICD-10-CM | POA: Insufficient documentation

## 2016-01-19 DIAGNOSIS — Y9289 Other specified places as the place of occurrence of the external cause: Secondary | ICD-10-CM | POA: Insufficient documentation

## 2016-01-19 DIAGNOSIS — Y9389 Activity, other specified: Secondary | ICD-10-CM | POA: Insufficient documentation

## 2016-01-19 DIAGNOSIS — F329 Major depressive disorder, single episode, unspecified: Secondary | ICD-10-CM | POA: Insufficient documentation

## 2016-01-19 DIAGNOSIS — Z79899 Other long term (current) drug therapy: Secondary | ICD-10-CM | POA: Insufficient documentation

## 2016-01-19 LAB — CBC WITH DIFFERENTIAL/PLATELET
BASOS PCT: 0 %
Basophils Absolute: 0 10*3/uL (ref 0.0–0.1)
EOS ABS: 0.2 10*3/uL (ref 0.0–0.7)
EOS PCT: 3 %
HCT: 38 % (ref 36.0–46.0)
Hemoglobin: 12.7 g/dL (ref 12.0–15.0)
Lymphocytes Relative: 44 %
Lymphs Abs: 3 10*3/uL (ref 0.7–4.0)
MCH: 30.5 pg (ref 26.0–34.0)
MCHC: 33.4 g/dL (ref 30.0–36.0)
MCV: 91.3 fL (ref 78.0–100.0)
MONO ABS: 0.5 10*3/uL (ref 0.1–1.0)
MONOS PCT: 8 %
Neutro Abs: 3.1 10*3/uL (ref 1.7–7.7)
Neutrophils Relative %: 45 %
Platelets: 231 10*3/uL (ref 150–400)
RBC: 4.16 MIL/uL (ref 3.87–5.11)
RDW: 12.2 % (ref 11.5–15.5)
WBC: 6.9 10*3/uL (ref 4.0–10.5)

## 2016-01-19 LAB — BASIC METABOLIC PANEL
Anion gap: 9 (ref 5–15)
BUN: 10 mg/dL (ref 6–20)
CO2: 24 mmol/L (ref 22–32)
CREATININE: 0.64 mg/dL (ref 0.44–1.00)
Calcium: 9.2 mg/dL (ref 8.9–10.3)
Chloride: 104 mmol/L (ref 101–111)
GFR calc Af Amer: >60 mL/min (ref >60)
Glucose, Bld: 82 mg/dL (ref 65–99)
Potassium: 4.2 mmol/L (ref 3.5–5.1)
SODIUM: 137 mmol/L (ref 135–145)

## 2016-01-19 LAB — I-STAT BETA HCG BLOOD, ED (MC, WL, AP ONLY): I-stat hCG, quantitative: <5 m[IU]/mL (ref <5)

## 2016-01-19 LAB — SALICYLATE LEVEL

## 2016-01-19 LAB — RAPID URINE DRUG SCREEN, HOSP PERFORMED
Amphetamines: NOT DETECTED
BENZODIAZEPINES: POSITIVE — AB
Barbiturates: NOT DETECTED
COCAINE: NOT DETECTED
OPIATES: NOT DETECTED
Tetrahydrocannabinol: POSITIVE — AB

## 2016-01-19 LAB — ETHANOL: Alcohol, Ethyl (B): <5 mg/dL (ref <5)

## 2016-01-19 LAB — ACETAMINOPHEN LEVEL: Acetaminophen (Tylenol), Serum: <10 ug/mL — ABNORMAL LOW (ref 10–30)

## 2016-01-19 MED ORDER — DIPHENHYDRAMINE HCL 25 MG PO CAPS
25.0000 mg | ORAL_CAPSULE | Freq: Once | ORAL | Status: AC
  Administered 2016-01-19: 25 mg via ORAL
  Filled 2016-01-19: qty 1

## 2016-01-19 NOTE — ED Notes (Signed)
TTS cart at bedside. 

## 2016-01-19 NOTE — ED Notes (Signed)
Pt states "Carla Munoz are trying to kill me! I'm allergic to everything on that tray." Pt proceeds to pick at chicken and smash food into counter. Pt moved to room C20.

## 2016-01-19 NOTE — ED Notes (Signed)
Pt presents via [facility] EMS for possible overdose.  Pt reports taking 3 Xanex, unknown dosage after an argument with her girlfriend.  Pt reports wanting "to go to sleep for a long time".  Girlfriend found pt in bathroom.  On EMS arrival, pt was very lethargic , shallow respirations, and slurred speech.  EMS attemted to intubated-+gag reflex, unable to obtain.  EKG unremarkable, BP-110 sys palp, CBG-79.  EMS reports pt saying she did this to "punish herself".   On arrival, pt awake, talking to MD.  Airway intact.  Denies alcohol/drug use.  20 g LH.
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
## 2016-01-19 NOTE — ED Notes (Signed)
Pt wanded by security. 

## 2016-01-19 NOTE — ED Notes (Signed)
Pt changed into scrubs.  

## 2016-01-19 NOTE — ED Notes (Signed)
Pt placed 1 phone call.

## 2016-01-19 NOTE — ED Notes (Signed)
Patient reports she is "allergic" to "everything on my plate." Kitchen is closed. Offered patient [place] sandwich with applesauce. Patient flatly refused. Began to cry. Stated "I hate this place. I feel like I'm in jail." Sitter present.

## 2016-01-19 NOTE — ED Notes (Signed)
TTS speaking with pt at this time.

## 2016-01-19 NOTE — ED Notes (Signed)
Pt crying loudly and uncontrollably d/t no visitors at this time.  MD Proulx aware, filling out 24 hour IVC paperwork.  Pt made aware of [facility] rules and visiting hours.  Sitter at bedside.  Charge RN aware, at bedside speaking with pt.

## 2016-01-19 NOTE — ED Notes (Signed)
Staffing called for sitter.   

## 2016-01-19 NOTE — ED Provider Notes (Signed)
CSN: [id number]     Arrival date & time 01/19/16  1515 History   First MD Initiated Contact with Patient 01/19/16 1516     Chief Complaint  Patient presents with  . Drug Overdose     (Consider location/radiation/quality/duration/timing/severity/associated sxs/prior Treatment) HPI   80 y f w pmh seizures who comes in with ams.  Patient took 3 xanax PTA.  Unknown what strength.  It is unclear why she took the tabs, the pt says she was taking them to "get away."  She denies any co-ingestants.  Her girlfriend found her, altered, and called ems.  W/ ems she was initially altered w/ decreased respirations and ems bagged her and she woke up on the way to the hospital  Past Medical History  Diagnosis Date  . Seizures ([place])   . Brain tumor [facility])    History reviewed. No pertinent past surgical history. No family history on file. Social History  Substance Use Topics  . Smoking status: Current Every Day Smoker -- 0.50 packs/day    Types: Cigarettes  . Smokeless tobacco: None  . Alcohol Use: Yes     Comment: socially   OB History    No data available     Review of Systems  Constitutional: Negative for fever and chills.  HENT: Negative for nosebleeds.   Eyes: Negative for visual disturbance.  Respiratory: Negative for cough and shortness of breath.   Cardiovascular: Negative for chest pain.  Gastrointestinal: Negative for nausea, vomiting, abdominal pain, diarrhea and constipation.  Genitourinary: Negative for dysuria.  Skin: Negative for rash.  Neurological: Negative for weakness.  All other systems reviewed and are negative.     Allergies  Banana; Chocolate; Kiwi extract; Lactose intolerance (gi); Orange fruit; and Other  Home Medications   Prior to Admission medications   Medication Sig Start Date End Date Taking? Authorizing Provider  acetaminophen (TYLENOL) 500 MG tablet Take 1,000 mg by mouth every 6 (six) hours as needed for headache (pain).   Yes Historical Provider,  MD  divalproex (DEPAKOTE) 125 MG DR tablet Take 1 tablet (125 mg total) by mouth daily. 04/03/15  Yes Sherwood Gambler, MD  medroxyPROGESTERone (DEPO-PROVERA) 150 MG/ML injection Inject 150 mg into the muscle every 3 (three) months. 01/19/16 - pt is due for her next injection   Yes Historical Provider, MD  chlorpheniramine-HYDROcodone (TUSSIONEX PENNKINETIC ER) 10-8 MG/5ML LQCR Take 5 mLs by mouth every 12 (twelve) hours as needed for cough (and pain). Patient not taking: Reported on 01/19/2016 01/16/15   Clayton Bibles, PA-C  ibuprofen (ADVIL,MOTRIN) 600 MG tablet Take 1 tablet (600 mg total) by mouth every 6 (six) hours as needed. Patient not taking: Reported on 01/19/2016 03/25/15   Anderson Malta Piepenbrink, PA-C   BP 120/82 mmHg  Pulse 83  Temp(Src) 98.7 F (37.1 C) (Oral)  Resp 20  SpO2 100% Physical Exam  Constitutional: She is oriented to person, place, and time. No distress.  HENT:  Head: Normocephalic and atraumatic.  Eyes: EOM are normal. Pupils are equal, round, and reactive to light.  Neck: Normal range of motion. Neck supple.  Cardiovascular: Normal rate and intact distal pulses.   Pulmonary/Chest: No respiratory distress.  Abdominal: Soft. There is no tenderness.  Musculoskeletal: Normal range of motion.  Neurological: She is oriented to person, place, and time. She has normal strength. No cranial nerve deficit or sensory deficit. Coordination normal. GCS eye subscore is 4. GCS verbal subscore is 5. GCS motor subscore is 6.  Sleepy but easily arousable  to voice  Skin: No rash noted. She is not diaphoretic.  Psychiatric: She exhibits a depressed mood.    ED Course  Procedures (including critical care time) Labs Review Labs Reviewed  ACETAMINOPHEN LEVEL - Abnormal; Notable for the following:    Acetaminophen (Tylenol), Serum <10 (*)    All other components within normal limits  URINE RAPID DRUG SCREEN, HOSP PERFORMED - Abnormal; Notable for the following:    Benzodiazepines POSITIVE (*)     Tetrahydrocannabinol POSITIVE (*)    All other components within normal limits  CBC WITH DIFFERENTIAL/PLATELET  BASIC METABOLIC PANEL  ETHANOL  SALICYLATE LEVEL  I-STAT BETA HCG BLOOD, ED ([facility], [facility], [facility] ONLY)    Imaging Review No results found. I have personally reviewed and evaluated these images and lab results as part of my medical decision-making.   EKG Interpretation   Date/Time:  Sunday January 19 2016 15:29:31 EST Ventricular Rate:  75 PR Interval:  147 QRS Duration: 82 QT Interval:  369 QTC Calculation: 412 R Axis:   58 Text Interpretation:  Sinus rhythm Confirmed by ZAVITZ  MD, JOSHUA ([id number])  on 01/19/2016 3:33:14 PM      MDM   Final diagnoses:  None    24 y f w pmh seizures who comes in with ams.    Exam as above.  Pt sleepy but easily arousable to voice.  Patient says that she took the xanax in order to get away from argument with her girlfriend.  Pt has reportedly had previous suicide attempt and is unable to tell me at this time whether this was an attempt.  Patient has continued to become more alert and is now at baseline.  Cbc/bmp/tyl/salicylate/etoh obtained and all unremarkable.  Psych consulted  Psych will plan to reassess in the AM so pt will be held overnight in the ED.  I have filled out a 24 hour hold.    Carla Matin, MD 01/19/16 2346  Carla Morrison, MD 01/20/16 [phone]

## 2016-01-19 NOTE — ED Notes (Signed)
Security called to wand pt  

## 2016-01-20 DIAGNOSIS — F0631 Mood disorder due to known physiological condition with depressive features: Secondary | ICD-10-CM

## 2016-01-20 MED ORDER — DIVALPROEX SODIUM 125 MG PO DR TAB
125.0000 mg | DELAYED_RELEASE_TABLET | Freq: Every day | ORAL | Status: DC
  Administered 2016-01-20: 125 mg via ORAL
  Filled 2016-01-20: qty 1

## 2016-01-20 NOTE — ED Notes (Signed)
IV been removed from left forehand. Bleeding controlled, 2x2 applied with tape.

## 2016-01-20 NOTE — Consult Note (Signed)
Telepsych Consultation   Reason for Consult: Discharge Disposition Referring Physician: Zacarias Pontes EDP Patient Identification: Carla Munoz MRN:  [id number] Principal Diagnosis: <principal problem not specified> Diagnosis:   Patient Active Problem List   Diagnosis Date Noted  . 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 female patient admitted to the [facility] after taking 2.5 xanax pills due to feeling increased stress related to financial problems. Patient stated "I did not try to kill myself. I had just worked six days in a row. When my stress gets bad it can trigger a seizure. I was diagnosed with a condition in the ninth grade. I have had five surgeries and now take Depakote to treat seizures. I was given xanax because my MD felt my anxiety is related to my condition. I get sad from time to time. I have a lot on me trying to provide for myself and work two jobs. I have been struggling to afford my medications. I have never attempted suicide before. I was raised by my grandmother because my mother died when I was [age]. She instilled some strong religious beliefs in me. I believe that if you try to hurt yourself that the soul automatically goes to hell. I have support from my brothers and my friend. I just want to go home so I can keep my jobs and take care of myself."   HPI:    Carla Munoz is a 25 year old female who presented to the [facility] via ambulance due to taking extra xanax to deal with stressors in her life. On admission the patient denied any suicidal ideation or past attempts. Patient reports that occasionally she becomes sad and tearful. Gwenna attributes this to her life situation rather than the presence of any mood disorder. Based on her description the patient does not meet the criteria for a depressive disorder. Patient denies that she takes the  xanax on a frequent basis stating "I only take it when I really need it. Like when I'm so stressed a seizure could happen." Her brother was contacted for collateral Rayvon [phone]) who did not express any concerns about her safety. He reported that his family has been trying to help Estephania to afford medications. Her brother reported that patient has been working two jobs to "make ends meet." Patient is requesting discharge from the hospital with outpatient follow up. She appears motivated to get help after leaving and is not perceived to be a risk to herself. Patient appeared to have protective factors such as strong religious beliefs and responsibility to her family. She reported taking care of her grandmother who raised her. It appeared that patient's symptoms of depression and anxiety are related to her seizure disorder, for which the patient has been taking Depakote. Patient reports that her recent Providers for this condition has been inconsistent due to her financial problems. Clairessa reports that she has the contact information for [facility] and has adequate transportation to get there after discharge.   Past Psychiatric History: Depression, Anxiety   Risk to Self: Suicidal Ideation: No Suicidal Intent: No Is patient at risk for suicide?: No Suicidal Plan?: No Access to Means: Yes (Machete) What has been your use of drugs/alcohol within the last 12 months?: Pt denied recent use; UDS indicated THC Intentional Self Injurious Behavior: None (Pt denied, but stated that she is cut because clumsy)  Risk to Others: Homicidal Ideation: No Thoughts of Harm to Others: No Current Homicidal Intent: No Current Homicidal Plan: No Access to Homicidal Means: No History of harm to others?: No Assessment of Violence: None Noted Does patient have access to weapons?: Yes (Comment) (Machete) Criminal Charges Pending?: No Does patient have a court date: No Prior Inpatient Therapy: Prior Inpatient Therapy: Yes  (Inpatient in [place]) Reason for Treatment: Client said she accidentally overdosed Prior Outpatient Therapy: Prior Outpatient Therapy: No Does patient have an ACCT team?: No Does patient have Intensive In-House Services?  : No Does patient have [facility] services? : No Does patient have P4CC services?: No  Past Medical History:  Past Medical History  Diagnosis Date  . Seizures ([place])   . Brain tumor [facility])    History reviewed. No pertinent past surgical history. Family History: No family history on file. Social History:  History  Alcohol Use  . Yes    Comment: socially     History  Drug Use No    Social History   Social History  . Marital Status: Married    Spouse Name: N/A  . Number of Children: N/A  . Years of Education: N/A   Social History Main Topics  . Smoking status: Current Every Day Smoker -- 0.50 packs/day    Types: Cigarettes  . Smokeless tobacco: None  . Alcohol Use: Yes     Comment: socially  . Drug Use: No  . Sexual Activity: Yes    Birth Control/ Protection: None   Other Topics Concern  . None   Social History Narrative   Additional Social History:    Allergies:   Allergies  Allergen Reactions  . Banana Swelling    Lips swelling   . Chocolate Hives  . Kiwi Extract Swelling    Lips swelling   . Lactose Intolerance (Gi) Diarrhea  . Orange Fruit [Citrus] Hives    hives  . Other Other (See Comments)    Walnuts- cant breathe     Labs:  Results for orders placed or performed during the hospital encounter of 01/19/16 (from the past 48 hour(s))  CBC with Differential     Status: None   Collection Time: 01/19/16  3:52 PM  Result Value Ref Range   WBC 6.9 4.0 - 10.5 K/uL   RBC 4.16 3.87 - 5.11 MIL/uL   Hemoglobin 12.7 12.0 - 15.0 g/dL   HCT 38.0 36.0 - 46.0 %   MCV 91.3 78.0 - 100.0 fL   MCH 30.5 26.0 - 34.0 pg   MCHC 33.4 30.0 - 36.0 g/dL   RDW 12.2 11.5 - 15.5 %   Platelets 231 150 - 400 K/uL   Neutrophils Relative % 45 %    Neutro Abs 3.1 1.7 - 7.7 K/uL   Lymphocytes Relative 44 %   Lymphs Abs 3.0 0.7 - 4.0 K/uL   Monocytes Relative 8 %   Monocytes Absolute 0.5 0.1 - 1.0 K/uL   Eosinophils Relative 3 %   Eosinophils Absolute 0.2 0.0 - 0.7 K/uL   Basophils Relative 0 %   Basophils Absolute 0.0 0.0 - 0.1 K/uL  Basic metabolic panel     Status: None   Collection Time: 01/19/16  3:52 PM  Result Value Ref Range   Sodium 137 135 - 145 mmol/L   Potassium 4.2 3.5 - 5.1 mmol/L   Chloride 104 101 - 111 mmol/L   CO2 24 22 - 32 mmol/L   Glucose, Bld 82 65 - 99  mg/dL   BUN 10 6 - 20 mg/dL   Creatinine, Ser 0.64 0.44 - 1.00 mg/dL   Calcium 9.2 8.9 - 10.3 mg/dL   GFR calc non Af Amer >60 >60 mL/min   GFR calc Af Amer >60 >60 mL/min    Comment: (NOTE) The eGFR has been calculated using the CKD EPI equation. This calculation has not been validated in all clinical situations. eGFR's persistently <60 mL/min signify possible Chronic Kidney Disease.    Anion gap 9 5 - 15  Ethanol     Status: None   Collection Time: 01/19/16  3:52 PM  Result Value Ref Range   Alcohol, Ethyl (B) <5 <5 mg/dL    Comment:        LOWEST DETECTABLE LIMIT FOR SERUM ALCOHOL IS 5 mg/dL FOR MEDICAL PURPOSES ONLY   Acetaminophen level     Status: Abnormal   Collection Time: 01/19/16  3:52 PM  Result Value Ref Range   Acetaminophen (Tylenol), Serum <10 (L) 10 - 30 ug/mL    Comment:        THERAPEUTIC CONCENTRATIONS VARY SIGNIFICANTLY. A RANGE OF 10-30 ug/mL MAY BE AN EFFECTIVE CONCENTRATION FOR MANY PATIENTS. HOWEVER, SOME ARE BEST TREATED AT CONCENTRATIONS OUTSIDE THIS RANGE. ACETAMINOPHEN CONCENTRATIONS >150 ug/mL AT 4 HOURS AFTER INGESTION AND >50 ug/mL AT 12 HOURS AFTER INGESTION ARE OFTEN ASSOCIATED WITH TOXIC REACTIONS.   Salicylate level     Status: None   Collection Time: 01/19/16  3:52 PM  Result Value Ref Range   Salicylate Lvl <7.3 2.8 - 30.0 mg/dL  I-Stat Beta hCG blood, ED ([facility], [facility], [facility] only)     Status: None    Collection Time: 01/19/16  3:57 PM  Result Value Ref Range   I-stat hCG, quantitative <5.0 <5 mIU/mL   Comment 3            Comment:   GEST. AGE      CONC.  (mIU/mL)   <=1 WEEK        5 - 50     2 WEEKS       50 - 500     3 WEEKS       100 - 10,000     4 WEEKS     1,000 - 30,000        FEMALE AND NON-PREGNANT FEMALE:     LESS THAN 5 mIU/mL   Urine rapid drug screen (hosp performed)     Status: Abnormal   Collection Time: 01/19/16  4:00 PM  Result Value Ref Range   Opiates NONE DETECTED NONE DETECTED   Cocaine NONE DETECTED NONE DETECTED   Benzodiazepines POSITIVE (A) NONE DETECTED   Amphetamines NONE DETECTED NONE DETECTED   Tetrahydrocannabinol POSITIVE (A) NONE DETECTED   Barbiturates NONE DETECTED NONE DETECTED    Comment:        DRUG SCREEN FOR MEDICAL PURPOSES ONLY.  IF CONFIRMATION IS NEEDED FOR ANY PURPOSE, NOTIFY LAB WITHIN 5 DAYS.        LOWEST DETECTABLE LIMITS FOR URINE DRUG SCREEN Drug Class       Cutoff (ng/mL) Amphetamine      1000 Barbiturate      200 Benzodiazepine   736 Tricyclics       681 Opiates          300 Cocaine          300 THC              50  No current facility-administered medications for this encounter.   Current Outpatient Prescriptions  Medication Sig Dispense Refill  . acetaminophen (TYLENOL) 500 MG tablet Take 1,000 mg by mouth every 6 (six) hours as needed for headache (pain).    Marland Kitchen divalproex (DEPAKOTE) 125 MG DR tablet Take 1 tablet (125 mg total) by mouth daily. 30 tablet 0  . medroxyPROGESTERone (DEPO-PROVERA) 150 MG/ML injection Inject 150 mg into the muscle every 3 (three) months. 01/19/16 - pt is due for her next injection    . chlorpheniramine-HYDROcodone (TUSSIONEX PENNKINETIC ER) 10-8 MG/5ML LQCR Take 5 mLs by mouth every 12 (twelve) hours as needed for cough (and pain). (Patient not taking: Reported on 01/19/2016) 100 mL 0  . ibuprofen (ADVIL,MOTRIN) 600 MG tablet Take 1 tablet (600 mg total) by mouth every 6 (six) hours as  needed. (Patient not taking: Reported on 01/19/2016) 30 tablet 0    Musculoskeletal: Strength & Muscle Tone: within normal limits Gait & Station: normal Patient leans: N/A  Psychiatric Specialty Exam: Review of Systems  Constitutional: Negative.   HENT: Negative.   Eyes: Negative.   Respiratory: Negative.   Cardiovascular: Negative.   Gastrointestinal: Negative.   Genitourinary: Negative.   Musculoskeletal: Negative.   Skin: Negative.   Neurological: Negative.   Endo/Heme/Allergies: Negative.   Psychiatric/Behavioral: Positive for depression (Stable ). Negative for suicidal ideas, hallucinations, memory loss and substance abuse. The patient is nervous/anxious. The patient does not have insomnia.     Blood pressure 92/54, pulse 72, temperature 98.4 F (36.9 C), temperature source Oral, resp. rate 16, SpO2 100 %.There is no weight on file to calculate BMI.  General Appearance: Casual  Eye Contact::  Good  Speech:  Clear and Coherent  Volume:  Normal  Mood:  Anxious  Affect:  Appropriate  Thought Process:  Goal Directed and Intact  Orientation:  Full (Time, Place, and Person)  Thought Content:  Symptoms, worries, concerns  Suicidal Thoughts:  No  Homicidal Thoughts:  No  Memory:  Immediate;   Good Recent;   Good Remote;   Good  Judgement:  Intact  Insight:  Present  Psychomotor Activity:  Normal  Concentration:  Good  Recall:  Good  Fund of Knowledge:Good  Language: Good  Akathisia:  No  Handed:  Right  AIMS (if indicated):     Assets:  Communication Skills Desire for Improvement Housing Intimacy Leisure Time Physical Health Resilience Social Support Talents/Skills  ADL's:  Intact  Cognition: WNL  Sleep:      Disposition: No evidence of imminent risk to self or others at present.   Patient does not meet criteria for psychiatric inpatient admission. Supportive therapy provided about ongoing stressors. Discussed crisis plan, support from social network,  calling 911, coming to the Emergency Department, and calling Suicide Hotline.  -Discharge to home with plan for patient to establish services at [facility] in [place] for medication management and psychotherapy   Elmarie Shiley, NP 01/20/2016 10:24 AM

## 2016-01-20 NOTE — ED Notes (Addendum)
Reordered breakfast for pt as pt stated she is unable to eat the breakfast tray provided to her. Pt reports she is allergic to eggs and lactose however requested a muffin, she then requested yogurt and this RN had to explain that there is also lactose in yogurt. Pt stated she was unaware. Pt reports her food allergies are as follows: tomatoes, oranges, limes, lemons, teas, lactose, eggs, bananas, kiwi, walnuts, black eye peas and melons (except she can eat watermelon).

## 2016-01-20 NOTE — ED Notes (Signed)
Pt ambulating independently w/ steady gait on d/c in no acute distress, A&Ox4.D/c instructions reviewed w/ pt and family - pt and family deny any further questions or concerns at present.  

## 2016-01-20 NOTE — Discharge Instructions (Signed)
Return to the ED with any concerns including thoughts or feelings of suicide or homicide, or any other alarming symptoms °

## 2017-12-14 NOTE — L&D Delivery Note (Signed)
Patient: Carla Munoz MRN: [id number]  GBS status: Negative , IAP given: none   Patient is a 27 y.o. now G1P1 s/p NSVD at [redacted]w[redacted]d, who was admitted for IOL for oligohydramnios. SROM 7h 52m prior to delivery with clear fluid.    Delivery Note At 3:36 AM a viable female was delivered via Vaginal, Spontaneous (Presentation: OA ).  APGAR: 7, 9; weight pending.  Placenta status: spontaneous, intact.  Cord: 3 vessel with the following complications:   Anesthesia:  Epidural  Episiotomy: None Lacerations:  First degree perineal  Suture Repair: None Est. Blood Loss (mL):  250  Head delivered OA. No nuchal cord present. Shoulder and body delivered in usual fashion. Infant with spontaneous cry, placed on mother's abdomen, dried and bulb suctioned. Cord clamped x 2 after 1-minute delay, and cut by family member. Cord blood drawn. Placenta delivered spontaneously with gentle cord traction. Trailing membranes noted in vaginal vault after delivery of placenta, removed with ring forceps. Fundus firm with massage and Pitocin. Perineum inspected and found to have first degree laceration, which was found to be hemostatic.  Mom to postpartum.  Baby to Couplet care / Skin to Skin.  Melina Schools 10/05/2018, 3:59 AM

## 2018-04-20 LAB — OB RESULTS CONSOLE HEPATITIS B SURFACE ANTIGEN: Hepatitis B Surface Ag: NEGATIVE

## 2018-04-20 LAB — OB RESULTS CONSOLE GC/CHLAMYDIA
Chlamydia: NEGATIVE
Gonorrhea: NEGATIVE

## 2018-04-20 LAB — OB RESULTS CONSOLE RPR: RPR: NONREACTIVE

## 2018-04-20 LAB — OB RESULTS CONSOLE HIV ANTIBODY (ROUTINE TESTING): HIV: NONREACTIVE

## 2018-04-20 LAB — OB RESULTS CONSOLE ABO/RH: RH TYPE: POSITIVE

## 2018-04-20 LAB — OB RESULTS CONSOLE RUBELLA ANTIBODY, IGM: Rubella: IMMUNE

## 2018-09-29 ENCOUNTER — Inpatient Hospital Stay (HOSPITAL_COMMUNITY)
Admission: AD | Admit: 2018-09-29 | Discharge: 2018-09-29 | Disposition: A | Discharge status: Home / Self Care | Payer: Medicaid Other | Source: Ambulatory Visit | Attending: Obstetrics and Gynecology | Admitting: Obstetrics and Gynecology

## 2018-09-29 ENCOUNTER — Encounter (HOSPITAL_COMMUNITY): Payer: Self-pay

## 2018-09-29 DIAGNOSIS — Z3A40 40 weeks gestation of pregnancy: Secondary | ICD-10-CM | POA: Diagnosis not present

## 2018-09-29 DIAGNOSIS — O471 False labor at or after 37 completed weeks of gestation: Secondary | ICD-10-CM | POA: Diagnosis not present

## 2018-09-29 DIAGNOSIS — O479 False labor, unspecified: Secondary | ICD-10-CM

## 2018-09-29 MED ORDER — LEVETIRACETAM 500 MG PO TABS: 1000.0000 mg | ORAL_TABLET | Freq: Every day | ORAL | Status: AC

## 2018-09-29 NOTE — MAU Note (Addendum)
Pt reports contractions every 5-6 mins. Denies LOF. Reports some bloody show. Reports good fetal movement. No recent cervical exam. Gets PNC at [place] to deliver here.

## 2018-09-29 NOTE — MAU Note (Signed)
I have communicated with [place] and reviewed vital signs:  Vitals:   09/29/18 2000 09/29/18 2132  BP: 121/82 123/87  Pulse: 89 96  Resp: 18 16  Temp: 97.7 F (36.5 C)   SpO2: 100%     Vaginal exam:  Dilation: 1 Effacement (%): 50 Cervical Position: Posterior Station: -3 Presentation: Vertex Exam by:: Maryagnes Amos RN,   Also reviewed contraction pattern and that non-stress test is reactive.  It has been documented that patient is contracting irregularly with uterine irritability with no cervical change over 1 hour not indicating active labor.  Patient denies any other complaints.  Based on this report provider has given order for discharge.  A discharge order and diagnosis entered by a provider.   Labor discharge instructions reviewed with patient.

## 2018-09-29 NOTE — Discharge Instructions (Signed)
Braxton Hicks Contractions °Contractions of the uterus can occur throughout pregnancy, but they are not always a sign that you are in labor. You may have practice contractions called Braxton Hicks contractions. These false labor contractions are sometimes confused with true labor. °What are Braxton Hicks contractions? °Braxton Hicks contractions are tightening movements that occur in the muscles of the uterus before labor. Unlike true labor contractions, these contractions do not result in opening (dilation) and thinning of the cervix. Toward the end of pregnancy (32-34 weeks), Braxton Hicks contractions can happen more often and may become stronger. These contractions are sometimes difficult to tell apart from true labor because they can be very uncomfortable. You should not feel embarrassed if you go to the hospital with false labor. °Sometimes, the only way to tell if you are in true labor is for your health care provider to look for changes in the cervix. The health care provider will do a physical exam and may monitor your contractions. If you are not in true labor, the exam should show that your cervix is not dilating and your water has not broken. °If there are other health problems associated with your pregnancy, it is completely safe for you to be sent home with false labor. You may continue to have Braxton Hicks contractions until you go into true labor. °How to tell the difference between true labor and false labor °True labor °· Contractions last 30-70 seconds. °· Contractions become very regular. °· Discomfort is usually felt in the top of the uterus, and it spreads to the lower abdomen and low back. °· Contractions do not go away with walking. °· Contractions usually become more intense and increase in frequency. °· The cervix dilates and gets thinner. °False labor °· Contractions are usually shorter and not as strong as true labor contractions. °· Contractions are usually irregular. °· Contractions  are often felt in the front of the lower abdomen and in the groin. °· Contractions may go away when you walk around or change positions while lying down. °· Contractions get weaker and are shorter-lasting as time goes on. °· The cervix usually does not dilate or become thin. °Follow these instructions at home: °· Take over-the-counter and prescription medicines only as told by your health care provider. °· Keep up with your usual exercises and follow other instructions from your health care provider. °· Eat and drink lightly if you think you are going into labor. °· If Braxton Hicks contractions are making you uncomfortable: °? Change your position from lying down or resting to walking, or change from walking to resting. °? Sit and rest in a tub of warm water. °? Drink enough fluid to keep your urine pale yellow. Dehydration may cause these contractions. °? Do slow and deep breathing several times an hour. °· Keep all follow-up prenatal visits as told by your health care provider. This is important. °Contact a health care provider if: °· You have a fever. °· You have continuous pain in your abdomen. °Get help right away if: °· Your contractions become stronger, more regular, and closer together. °· You have fluid leaking or gushing from your vagina. °· You pass blood-tinged mucus (bloody show). °· You have bleeding from your vagina. °· You have low back pain that you never had before. °· You feel your baby’s head pushing down and causing pelvic pressure. °· Your baby is not moving inside you as much as it used to. °Summary °· Contractions that occur before labor are called Braxton   Hicks contractions, false labor, or practice contractions. °· Braxton Hicks contractions are usually shorter, weaker, farther apart, and less regular than true labor contractions. True labor contractions usually become progressively stronger and regular and they become more frequent. °· Manage discomfort from Braxton Hicks contractions by  changing position, resting in a warm bath, drinking plenty of water, or practicing deep breathing. °This information is not intended to replace advice given to you by your health care provider. Make sure you discuss any questions you have with your health care provider. °Document Released: 04/15/2017 Document Revised: 04/15/2017 Document Reviewed: 04/15/2017 °Elsevier Interactive Patient Education © 2018 [employer] ° °Fetal Movement Counts °Patient Name: ________________________________________________ Patient Due Date: ____________________ °What is a fetal movement count? °A fetal movement count is the number of times that you feel your baby move during a certain amount of time. This may also be called a fetal kick count. A fetal movement count is recommended for every pregnant woman. You may be asked to start counting fetal movements as early as week 28 of your pregnancy. °Pay attention to when your baby is most active. You may notice your baby's sleep and wake cycles. You may also notice things that make your baby move more. You should do a fetal movement count: °· When your baby is normally most active. °· At the same time each day. ° °A good time to count movements is while you are resting, after having something to eat and drink. °How do I count fetal movements? °1. Find a quiet, comfortable area. Sit, or lie down on your side. °2. Write down the date, the start time and stop time, and the number of movements that you felt between those two times. Take this information with you to your health care visits. °3. For 2 hours, count kicks, flutters, swishes, rolls, and jabs. You should feel at least 10 movements during 2 hours. °4. You may stop counting after you have felt 10 movements. °5. If you do not feel 10 movements in 2 hours, have something to eat and drink. Then, keep resting and counting for 1 hour. If you feel at least 4 movements during that hour, you may stop counting. °Contact a health care  provider if: °· You feel fewer than 4 movements in 2 hours. °· Your baby is not moving like he or she usually does. °Date: ____________ Start time: ____________ Stop time: ____________ Movements: ____________ °Date: ____________ Start time: ____________ Stop time: ____________ Movements: ____________ °Date: ____________ Start time: ____________ Stop time: ____________ Movements: ____________ °Date: ____________ Start time: ____________ Stop time: ____________ Movements: ____________ °Date: ____________ Start time: ____________ Stop time: ____________ Movements: ____________ °Date: ____________ Start time: ____________ Stop time: ____________ Movements: ____________ °Date: ____________ Start time: ____________ Stop time: ____________ Movements: ____________ °Date: ____________ Start time: ____________ Stop time: ____________ Movements: ____________ °Date: ____________ Start time: ____________ Stop time: ____________ Movements: ____________ °This information is not intended to replace advice given to you by your health care provider. Make sure you discuss any questions you have with your health care provider. °Document Released: 12/30/2006 Document Revised: 07/29/2016 Document Reviewed: 01/09/2016 °Elsevier Interactive Patient Education © 2018 [employer] ° °

## 2018-10-01 ENCOUNTER — Inpatient Hospital Stay (HOSPITAL_COMMUNITY)
Admission: AD | Admit: 2018-10-01 | Discharge: 2018-10-02 | Disposition: A | Discharge status: Home / Self Care | Payer: Medicaid Other | Attending: Obstetrics and Gynecology | Admitting: Obstetrics and Gynecology

## 2018-10-01 DIAGNOSIS — Z87891 Personal history of nicotine dependence: Secondary | ICD-10-CM | POA: Insufficient documentation

## 2018-10-01 DIAGNOSIS — O99013 Anemia complicating pregnancy, third trimester: Secondary | ICD-10-CM

## 2018-10-01 DIAGNOSIS — Z3A39 39 weeks gestation of pregnancy: Secondary | ICD-10-CM

## 2018-10-01 DIAGNOSIS — R42 Dizziness and giddiness: Secondary | ICD-10-CM | POA: Insufficient documentation

## 2018-10-01 DIAGNOSIS — O368130 Decreased fetal movements, third trimester, not applicable or unspecified: Secondary | ICD-10-CM | POA: Insufficient documentation

## 2018-10-01 DIAGNOSIS — O26893 Other specified pregnancy related conditions, third trimester: Secondary | ICD-10-CM | POA: Insufficient documentation

## 2018-10-01 DIAGNOSIS — Z3689 Encounter for other specified antenatal screening: Secondary | ICD-10-CM

## 2018-10-01 DIAGNOSIS — R002 Palpitations: Secondary | ICD-10-CM | POA: Insufficient documentation

## 2018-10-01 DIAGNOSIS — D649 Anemia, unspecified: Secondary | ICD-10-CM | POA: Insufficient documentation

## 2018-10-02 ENCOUNTER — Encounter (HOSPITAL_COMMUNITY): Payer: Self-pay

## 2018-10-02 ENCOUNTER — Other Ambulatory Visit: Payer: Self-pay

## 2018-10-02 DIAGNOSIS — R002 Palpitations: Secondary | ICD-10-CM | POA: Diagnosis not present

## 2018-10-02 DIAGNOSIS — O99013 Anemia complicating pregnancy, third trimester: Secondary | ICD-10-CM

## 2018-10-02 DIAGNOSIS — Z3A39 39 weeks gestation of pregnancy: Secondary | ICD-10-CM

## 2018-10-02 DIAGNOSIS — O368130 Decreased fetal movements, third trimester, not applicable or unspecified: Secondary | ICD-10-CM

## 2018-10-02 DIAGNOSIS — O26893 Other specified pregnancy related conditions, third trimester: Secondary | ICD-10-CM | POA: Diagnosis present

## 2018-10-02 DIAGNOSIS — Z87891 Personal history of nicotine dependence: Secondary | ICD-10-CM | POA: Diagnosis not present

## 2018-10-02 DIAGNOSIS — R42 Dizziness and giddiness: Secondary | ICD-10-CM | POA: Diagnosis not present

## 2018-10-02 DIAGNOSIS — D649 Anemia, unspecified: Secondary | ICD-10-CM | POA: Diagnosis not present

## 2018-10-02 LAB — URINALYSIS, ROUTINE W REFLEX MICROSCOPIC
Bilirubin Urine: NEGATIVE
Glucose, UA: NEGATIVE mg/dL
Hgb urine dipstick: NEGATIVE
Ketones, ur: NEGATIVE mg/dL
Nitrite: NEGATIVE
Protein, ur: NEGATIVE mg/dL
Specific Gravity, Urine: 1.017 (ref 1.005–1.030)
pH: 5 (ref 5.0–8.0)

## 2018-10-02 LAB — CBC
HEMATOCRIT: 28.5 % — AB (ref 36.0–46.0)
HEMOGLOBIN: 9.3 g/dL — AB (ref 12.0–15.0)
MCH: 28.7 pg (ref 26.0–34.0)
MCHC: 32.6 g/dL (ref 30.0–36.0)
MCV: 88 fL (ref 80.0–100.0)
Platelets: 307 10*3/uL (ref 150–400)
RBC: 3.24 MIL/uL — AB (ref 3.87–5.11)
RDW: 12.5 % (ref 11.5–15.5)
WBC: 11.4 10*3/uL — AB (ref 4.0–10.5)

## 2018-10-02 LAB — OB RESULTS CONSOLE GBS: GBS: NEGATIVE

## 2018-10-02 MED ORDER — POLYSACCHARIDE IRON COMPLEX 150 MG PO CAPS: 150.0000 mg | ORAL_CAPSULE | Freq: Every day | ORAL | 0 refills | Status: DC

## 2018-10-02 NOTE — MAU Provider Note (Signed)
History     CSN: [id number]  Arrival date and time: 10/01/18 2350   First Provider Initiated Contact with Patient 10/02/18 [phone]      Chief Complaint  Patient presents with  . Contractions   G1 @39 .5 wks here with ctx, dizziness, and palpitations. Ctx started around 6 hrs ago and frequency is q4-5 min. No VB or LOF. FM is decreased. Dizziness and heart palpitations started today. She has known seizure disorder on Keppra and is worried these sx may precede a seizure. Denies CP but having SOB. Pt was getting care in [place] but plans to deliver here. Last visit was 3 weeks ago.   OB History    Gravida  1   Para      Term      Preterm      AB      Living        SAB      TAB      Ectopic      Multiple      Live Births              Past Medical History:  Diagnosis Date  . Brain tumor ([place])   . Seizures ([place])     Past Surgical History:  Procedure Laterality Date  . MOUTH SURGERY      History reviewed. No pertinent family history.  Social History   Tobacco Use  . Smoking status: Former Smoker    Packs/day: 0.50    Types: Cigarettes  . Smokeless tobacco: Never Used  Substance Use Topics  . Alcohol use: Not Currently    Comment: socially  . Drug use: No    Allergies:  Allergies  Allergen Reactions  . Banana Swelling    Lips swelling   . Chocolate Hives  . Kiwi Extract Swelling    Lips swelling   . Lactose Intolerance (Gi) Diarrhea  . Orange Fruit [Citrus] Hives    hives  . Other Other (See Comments)    Walnuts- cant breathe     Medications Prior to Admission  Medication Sig Dispense Refill Last Dose  . levETIRAcetam (KEPPRA) 500 MG tablet Take 2 tablets (1,000 mg total) by mouth at bedtime.   10/01/2018 at Unknown time  . Prenatal Vit-Fe Fumarate-FA (PRENATAL MULTIVITAMIN) TABS tablet Take 1 tablet by mouth daily at 12 noon.   Past Week at Unknown time  . chlorpheniramine-HYDROcodone (TUSSIONEX PENNKINETIC ER) 10-8 MG/5ML LQCR Take 5  mLs by mouth every 12 (twelve) hours as needed for cough (and pain). (Patient not taking: Reported on 01/19/2016) 100 mL 0 Not Taking at Unknown time    Review of Systems  Respiratory: Positive for shortness of breath.   Cardiovascular: Positive for palpitations. Negative for chest pain.  Gastrointestinal: Positive for abdominal pain.  Genitourinary: Negative for vaginal bleeding and vaginal discharge.  Neurological: Positive for dizziness. Negative for seizures and syncope.   Physical Exam   Blood pressure 123/74, pulse 88, temperature 98.1 F (36.7 C), temperature source Oral, resp. rate 18, height 5\' 8"  (1.727 m), weight 79.4 kg, SpO2 98 %. Orthostatic VS for the past 24 hrs:  BP- Lying Pulse- Lying BP- Sitting Pulse- Sitting BP- Standing at 0 minutes Pulse- Standing at 0 minutes  10/02/18 0042 - - - - (!) 130/97 104  10/02/18 0039 - - - - 125/89 88  10/02/18 0038 - - 125/78 88 - -  10/02/18 0037 116/80 89 - - - -     Physical Exam  Constitutional: She is oriented to person, place, and time. She appears well-developed and well-nourished. No distress.  HENT:  Head: Normocephalic and atraumatic.  Neck: Normal range of motion.  Cardiovascular: Normal rate, regular rhythm and normal heart sounds.  Respiratory: Effort normal and breath sounds normal. No respiratory distress. She has no wheezes. She has no rales.  Genitourinary:  Genitourinary Comments: VE: 1/50  Musculoskeletal: Normal range of motion.  Neurological: She is alert and oriented to person, place, and time.  Skin: Skin is warm and dry.  Psychiatric: She has a normal mood and affect.  EFM: 125 bpm, mod variability, +  accels, no decels Toco: 4-10  Results for orders placed or performed during the hospital encounter of 10/01/18 (from the past 24 hour(s))  Urinalysis, Routine w reflex microscopic     Status: Abnormal   Collection Time: 10/02/18 12:14 AM  Result Value Ref Range   Color, Urine YELLOW YELLOW    APPearance HAZY (A) CLEAR   Specific Gravity, Urine 1.017 1.005 - 1.030   pH 5.0 5.0 - 8.0   Glucose, UA NEGATIVE NEGATIVE mg/dL   Hgb urine dipstick NEGATIVE NEGATIVE   Bilirubin Urine NEGATIVE NEGATIVE   Ketones, ur NEGATIVE NEGATIVE mg/dL   Protein, ur NEGATIVE NEGATIVE mg/dL   Nitrite NEGATIVE NEGATIVE   Leukocytes, UA TRACE (A) NEGATIVE   RBC / HPF 0-5 0 - 5 RBC/hpf   WBC, UA 6-10 0 - 5 WBC/hpf   Bacteria, UA RARE (A) NONE SEEN   Squamous Epithelial / LPF 6-10 0 - 5   Mucus PRESENT    MAU Course  Procedures  MDM Labs and EKG ordered. EKG interpreted by Dr. Betsy Coder. No evidence of labor. NST reactive. Will treat anemia. Stable for discharge home.  Assessment and Plan   1. [redacted] weeks gestation of pregnancy   2. NST (non-stress test) reactive   3. Anemia during pregnancy in third trimester   4. Heart palpitations   5. Decreased fetal movements in third trimester, single or unspecified fetus    Discharge home Follow up in [place] on 10/22 @9 :00 Labor precautions Rx Ferrex  Allergies as of 10/02/2018      Reactions   Banana Swelling   Lips swelling    Chocolate Hives   Kiwi Extract Swelling   Lips swelling    Lactose Intolerance (gi) Diarrhea   Orange Fruit [citrus] Hives   hives   Other Other (See Comments)   Walnuts- cant breathe       Medication List    STOP taking these medications   chlorpheniramine-HYDROcodone 10-8 MG/5ML Lqcr Commonly known as:  TUSSIONEX     TAKE these medications   iron polysaccharides 150 MG capsule Commonly known as:  NIFEREX Take 1 capsule (150 mg total) by mouth daily.   levETIRAcetam 500 MG tablet Commonly known as:  KEPPRA Take 2 tablets (1,000 mg total) by mouth at bedtime.   prenatal multivitamin Tabs tablet Take 1 tablet by mouth daily at 12 noon.      Julianne Handler, CNM 10/02/2018, 1:30 AM

## 2018-10-02 NOTE — Discharge Instructions (Signed)
Fetal Movement Counts Patient Name: ________________________________________________ Patient Due Date: ____________________ What is a fetal movement count? A fetal movement count is the number of times that you feel your baby move during a certain amount of time. This may also be called a fetal kick count. A fetal movement count is recommended for every pregnant woman. You may be asked to start counting fetal movements as early as week 28 of your pregnancy. Pay attention to when your baby is most active. You may notice your baby's sleep and wake cycles. You may also notice things that make your baby move more. You should do a fetal movement count:  When your baby is normally most active.  At the same time each day.  A good time to count movements is while you are resting, after having something to eat and drink. How do I count fetal movements? 1. Find a quiet, comfortable area. Sit, or lie down on your side. 2. Write down the date, the start time and stop time, and the number of movements that you felt between those two times. Take this information with you to your health care visits. 3. For 2 hours, count kicks, flutters, swishes, rolls, and jabs. You should feel at least 10 movements during 2 hours. 4. You may stop counting after you have felt 10 movements. 5. If you do not feel 10 movements in 2 hours, have something to eat and drink. Then, keep resting and counting for 1 hour. If you feel at least 4 movements during that hour, you may stop counting. Contact a health care provider if:  You feel fewer than 4 movements in 2 hours.  Your baby is not moving like he or she usually does. Date: ____________ Start time: ____________ Stop time: ____________ Movements: ____________ Date: ____________ Start time: ____________ Stop time: ____________ Movements: ____________ Date: ____________ Start time: ____________ Stop time: ____________ Movements: ____________ Date: ____________ Start time:  ____________ Stop time: ____________ Movements: ____________ Date: ____________ Start time: ____________ Stop time: ____________ Movements: ____________ Date: ____________ Start time: ____________ Stop time: ____________ Movements: ____________ Date: ____________ Start time: ____________ Stop time: ____________ Movements: ____________ Date: ____________ Start time: ____________ Stop time: ____________ Movements: ____________ Date: ____________ Start time: ____________ Stop time: ____________ Movements: ____________ This information is not intended to replace advice given to you by your health care provider. Make sure you discuss any questions you have with your health care provider. Document Released: 12/30/2006 Document Revised: 07/29/2016 Document Reviewed: 01/09/2016 Elsevier Interactive Patient Education  2018 [employer].  Pregnancy and Anemia Anemia is a condition in which the concentration of red blood cells or hemoglobin in the blood is below normal. Hemoglobin is a substance in red blood cells that carries oxygen to the tissues of the body. Anemia results in not enough oxygen reaching these tissues. Anemia during pregnancy is common because the fetus uses more iron and folic acid as it is developing. Your body may not produce enough red blood cells because of this. Also, during pregnancy, the liquid part of the blood (plasma) increases by about 50%, and the red blood cells increase by only 25%. This lowers the concentration of the red blood cells and creates a natural anemia-like situation. What are the causes? The most common cause of anemia during pregnancy is not having enough iron in the body to make red blood cells (iron deficiency anemia). Other causes may include:  Folic acid deficiency.  Vitamin B12 deficiency.  Certain prescription or over-the-counter medicines.  Certain medical conditions or  infections that destroy red blood cells.  A low platelet count and bleeding  caused by antibodies that go through the placenta to the fetus from the mothers blood.  What are the signs or symptoms? Mild anemia may not be noticeable. If it becomes severe, symptoms may include:  Tiredness.  Shortness of breath, especially with exercise.  Weakness.  Fainting.  Pale looking skin.  Headaches.  Feeling a fast or irregular heartbeat (palpitations).  How is this diagnosed? The type of anemia is usually diagnosed from your family and medical history and blood tests. How is this treated? Treatment of anemia during pregnancy depends on the cause of the anemia. Treatment can include:  Supplements of iron, vitamin X83, or folic acid.  A blood transfusion. This may be needed if blood loss is severe.  Hospitalization. This may be needed if there is significant continual blood loss.  Dietary changes.  Follow these instructions at home:  Follow your dietitian's or health care provider's dietary recommendations.  Increase your vitamin C intake. This will help the stomach absorb more iron.  Eat a diet rich in iron. This would include foods such as: ? Liver. ? Beef. ? Whole grain bread. ? Eggs. ? Dried fruit.  Take iron and vitamins as directed by your health care provider.  Eat green leafy vegetables. These are a good source of folic acid. Contact a health care provider if:  You have frequent or lasting headaches.  You are looking pale.  You are bruising easily. Get help right away if:  You have extreme weakness, shortness of breath, or chest pain.  You become dizzy or have trouble concentrating.  You have heavy vaginal bleeding.  You develop a rash.  You have bloody or black, tarry stools.  You faint.  You vomit up blood.  You vomit repeatedly.  You have abdominal pain.  You have a fever or persistent symptoms for more than 2-3 days.  You have a fever and your symptoms suddenly get worse.  You are dehydrated. This information is  not intended to replace advice given to you by your health care provider. Make sure you discuss any questions you have with your health care provider. Document Released: 11/27/2000 Document Revised: 05/07/2016 Document Reviewed: 07/12/2013 Elsevier Interactive Patient Education  2017 [employer]  [employer] of the uterus can occur throughout pregnancy, but they are not always a sign that you are in labor. You may have practice contractions called Braxton Hicks contractions. These false labor contractions are sometimes confused with true labor. What are [place] contractions? Braxton Hicks contractions are tightening movements that occur in the muscles of the uterus before labor. Unlike true labor contractions, these contractions do not result in opening (dilation) and thinning of the cervix. Toward the end of pregnancy (32-34 weeks), Braxton Hicks contractions can happen more often and may become stronger. These contractions are sometimes difficult to tell apart from true labor because they can be very uncomfortable. You should not feel embarrassed if you go to the hospital with false labor. Sometimes, the only way to tell if you are in true labor is for your health care provider to look for changes in the cervix. The health care provider will do a physical exam and may monitor your contractions. If you are not in true labor, the exam should show that your cervix is not dilating and your water has not broken. If there are other health problems associated with your pregnancy, it is completely safe for  you to be sent home with false labor. You may continue to have Braxton Hicks contractions until you go into true labor. How to tell the difference between true labor and false labor True labor  Contractions last 30-70 seconds.  Contractions become very regular.  Discomfort is usually felt in the top of the uterus, and it spreads to the lower abdomen and low  back.  Contractions do not go away with walking.  Contractions usually become more intense and increase in frequency.  The cervix dilates and gets thinner. False labor  Contractions are usually shorter and not as strong as true labor contractions.  Contractions are usually irregular.  Contractions are often felt in the front of the lower abdomen and in the groin.  Contractions may go away when you walk around or change positions while lying down.  Contractions get weaker and are shorter-lasting as time goes on.  The cervix usually does not dilate or become thin. Follow these instructions at home:  Take over-the-counter and prescription medicines only as told by your health care provider.  Keep up with your usual exercises and follow other instructions from your health care provider.  Eat and drink lightly if you think you are going into labor.  If Braxton Hicks contractions are making you uncomfortable: ? Change your position from lying down or resting to walking, or change from walking to resting. ? Sit and rest in a tub of warm water. ? Drink enough fluid to keep your urine pale yellow. Dehydration may cause these contractions. ? Do slow and deep breathing several times an hour.  Keep all follow-up prenatal visits as told by your health care provider. This is important. Contact a health care provider if:  You have a fever.  You have continuous pain in your abdomen. Get help right away if:  Your contractions become stronger, more regular, and closer together.  You have fluid leaking or gushing from your vagina.  You pass blood-tinged mucus (bloody show).  You have bleeding from your vagina.  You have low back pain that you never had before.  You feel your babys head pushing down and causing pelvic pressure.  Your baby is not moving inside you as much as it used to. Summary  Contractions that occur before labor are called Braxton Hicks contractions, false  labor, or practice contractions.  Braxton Hicks contractions are usually shorter, weaker, farther apart, and less regular than true labor contractions. True labor contractions usually become progressively stronger and regular and they become more frequent.  Manage discomfort from [facility] contractions by changing position, resting in a warm bath, drinking plenty of water, or practicing deep breathing. This information is not intended to replace advice given to you by your health care provider. Make sure you discuss any questions you have with your health care provider. Document Released: 04/15/2017 Document Revised: 04/15/2017 Document Reviewed: 04/15/2017 Elsevier Interactive Patient Education  2018 [employer].

## 2018-10-02 NOTE — MAU Note (Signed)
Pt. States that she has come in due to the fact that the pregancy is beginning to be "too much". Pt. States she has seizures and has been feeling dizzy and contributes it to pregnancy. Pt. States poor fetal movement. Pt. Reports falling in [place] and going to [facility] who said she had a slow leak. Stated baby looked good before leaving. Pt. Reports chest pain and states it is hard to breathe. Ctx began at 1800 and are 4-5 min apart and rates then 9/10.

## 2018-10-04 ENCOUNTER — Ambulatory Visit: Payer: Self-pay | Admitting: Clinical

## 2018-10-04 ENCOUNTER — Encounter (HOSPITAL_COMMUNITY): Payer: Self-pay | Admitting: *Deleted

## 2018-10-04 ENCOUNTER — Inpatient Hospital Stay (HOSPITAL_COMMUNITY)
Admission: AD | Admit: 2018-10-04 | Discharge: 2018-10-07 | DRG: 806 | Disposition: A | Discharge status: Home / Self Care | Payer: Medicaid Other | Attending: Family Medicine | Admitting: Family Medicine

## 2018-10-04 ENCOUNTER — Inpatient Hospital Stay (HOSPITAL_COMMUNITY): Payer: Medicaid Other | Admitting: Anesthesiology

## 2018-10-04 ENCOUNTER — Encounter: Payer: Self-pay | Admitting: Obstetrics & Gynecology

## 2018-10-04 ENCOUNTER — Ambulatory Visit (INDEPENDENT_AMBULATORY_CARE_PROVIDER_SITE_OTHER): Payer: Medicaid Other | Admitting: Obstetrics & Gynecology

## 2018-10-04 DIAGNOSIS — O26843 Uterine size-date discrepancy, third trimester: Secondary | ICD-10-CM

## 2018-10-04 DIAGNOSIS — O288 Other abnormal findings on antenatal screening of mother: Secondary | ICD-10-CM

## 2018-10-04 DIAGNOSIS — Z23 Encounter for immunization: Secondary | ICD-10-CM | POA: Diagnosis not present

## 2018-10-04 DIAGNOSIS — O99330 Smoking (tobacco) complicating pregnancy, unspecified trimester: Secondary | ICD-10-CM

## 2018-10-04 DIAGNOSIS — Z3A40 40 weeks gestation of pregnancy: Secondary | ICD-10-CM

## 2018-10-04 DIAGNOSIS — O99019 Anemia complicating pregnancy, unspecified trimester: Secondary | ICD-10-CM | POA: Insufficient documentation

## 2018-10-04 DIAGNOSIS — O0930 Supervision of pregnancy with insufficient antenatal care, unspecified trimester: Secondary | ICD-10-CM

## 2018-10-04 DIAGNOSIS — O99353 Diseases of the nervous system complicating pregnancy, third trimester: Secondary | ICD-10-CM

## 2018-10-04 DIAGNOSIS — O4103X0 Oligohydramnios, third trimester, not applicable or unspecified: Principal | ICD-10-CM | POA: Diagnosis present

## 2018-10-04 DIAGNOSIS — O26849 Uterine size-date discrepancy, unspecified trimester: Secondary | ICD-10-CM

## 2018-10-04 DIAGNOSIS — O99350 Diseases of the nervous system complicating pregnancy, unspecified trimester: Secondary | ICD-10-CM

## 2018-10-04 DIAGNOSIS — G40909 Epilepsy, unspecified, not intractable, without status epilepticus: Secondary | ICD-10-CM

## 2018-10-04 DIAGNOSIS — O480 Post-term pregnancy: Secondary | ICD-10-CM | POA: Diagnosis not present

## 2018-10-04 DIAGNOSIS — D649 Anemia, unspecified: Secondary | ICD-10-CM | POA: Diagnosis present

## 2018-10-04 DIAGNOSIS — O9902 Anemia complicating childbirth: Secondary | ICD-10-CM | POA: Diagnosis present

## 2018-10-04 DIAGNOSIS — Z87891 Personal history of nicotine dependence: Secondary | ICD-10-CM

## 2018-10-04 DIAGNOSIS — O99013 Anemia complicating pregnancy, third trimester: Secondary | ICD-10-CM

## 2018-10-04 DIAGNOSIS — O0933 Supervision of pregnancy with insufficient antenatal care, third trimester: Secondary | ICD-10-CM

## 2018-10-04 DIAGNOSIS — O99333 Smoking (tobacco) complicating pregnancy, third trimester: Secondary | ICD-10-CM

## 2018-10-04 DIAGNOSIS — O4100X0 Oligohydramnios, unspecified trimester, not applicable or unspecified: Secondary | ICD-10-CM | POA: Diagnosis present

## 2018-10-04 DIAGNOSIS — Z87898 Personal history of other specified conditions: Secondary | ICD-10-CM

## 2018-10-04 DIAGNOSIS — O99354 Diseases of the nervous system complicating childbirth: Secondary | ICD-10-CM | POA: Diagnosis present

## 2018-10-04 DIAGNOSIS — Z3A41 41 weeks gestation of pregnancy: Secondary | ICD-10-CM | POA: Diagnosis not present

## 2018-10-04 HISTORY — DX: Anemia, unspecified: D64.9

## 2018-10-04 HISTORY — DX: Bipolar disorder, current episode manic without psychotic features, moderate: F31.12

## 2018-10-04 LAB — POCT URINALYSIS DIP (DEVICE)
BILIRUBIN URINE: NEGATIVE
Glucose, UA: NEGATIVE mg/dL
Hgb urine dipstick: NEGATIVE
Ketones, ur: NEGATIVE mg/dL
NITRITE: NEGATIVE
PH: 6.5 (ref 5.0–8.0)
Protein, ur: NEGATIVE mg/dL
Specific Gravity, Urine: 1.015 (ref 1.005–1.030)
Urobilinogen, UA: 0.2 mg/dL (ref 0.0–1.0)

## 2018-10-04 LAB — RAPID URINE DRUG SCREEN, HOSP PERFORMED
AMPHETAMINES: NOT DETECTED
Barbiturates: NOT DETECTED
Benzodiazepines: NOT DETECTED
COCAINE: NOT DETECTED
OPIATES: NOT DETECTED
TETRAHYDROCANNABINOL: NOT DETECTED

## 2018-10-04 LAB — CBC
HCT: 30.6 % — ABNORMAL LOW (ref 36.0–46.0)
Hemoglobin: 9.9 g/dL — ABNORMAL LOW (ref 12.0–15.0)
MCH: 28.4 pg (ref 26.0–34.0)
MCHC: 32.4 g/dL (ref 30.0–36.0)
MCV: 87.7 fL (ref 80.0–100.0)
PLATELETS: 310 10*3/uL (ref 150–400)
RBC: 3.49 MIL/uL — ABNORMAL LOW (ref 3.87–5.11)
RDW: 12.7 % (ref 11.5–15.5)
WBC: 10.3 10*3/uL (ref 4.0–10.5)

## 2018-10-04 LAB — TYPE AND SCREEN
ABO/RH(D): O POS
ANTIBODY SCREEN: NEGATIVE

## 2018-10-04 LAB — CULTURE, BETA STREP (GROUP B ONLY)

## 2018-10-04 LAB — WET PREP, GENITAL
Clue Cells Wet Prep HPF POC: NONE SEEN
Sperm: NONE SEEN
Trich, Wet Prep: NONE SEEN
Yeast Wet Prep HPF POC: NONE SEEN

## 2018-10-04 MED ORDER — LEVETIRACETAM 500 MG PO TABS
500.0000 mg | ORAL_TABLET | Freq: Every day | ORAL | Status: DC
  Administered 2018-10-04 – 2018-10-06 (×3): 500 mg via ORAL
  Filled 2018-10-04 (×2): qty 1

## 2018-10-04 MED ORDER — LIDOCAINE HCL (PF) 1 % IJ SOLN
30.0000 mL | INTRAMUSCULAR | Status: DC | PRN
  Filled 2018-10-04: qty 30

## 2018-10-04 MED ORDER — DIPHENHYDRAMINE HCL 50 MG/ML IJ SOLN: 12.5000 mg | INTRAMUSCULAR | Status: DC | PRN

## 2018-10-04 MED ORDER — TERBUTALINE SULFATE 1 MG/ML IJ SOLN
0.2500 mg | Freq: Once | INTRAMUSCULAR | Status: DC | PRN
  Filled 2018-10-04: qty 1

## 2018-10-04 MED ORDER — LEVETIRACETAM 500 MG PO TABS
1000.0000 mg | ORAL_TABLET | Freq: Every day | ORAL | Status: DC
  Filled 2018-10-04: qty 2

## 2018-10-04 MED ORDER — OXYCODONE-ACETAMINOPHEN 5-325 MG PO TABS
2.0000 | ORAL_TABLET | ORAL | Status: DC | PRN
  Administered 2018-10-05 – 2018-10-07 (×9): 2 via ORAL
  Filled 2018-10-04 (×10): qty 2

## 2018-10-04 MED ORDER — PHENYLEPHRINE 40 MCG/ML (10ML) SYRINGE FOR IV PUSH (FOR BLOOD PRESSURE SUPPORT)
80.0000 ug | PREFILLED_SYRINGE | INTRAVENOUS | Status: DC | PRN
  Filled 2018-10-04: qty 5
  Filled 2018-10-04: qty 10

## 2018-10-04 MED ORDER — LACTATED RINGERS IV SOLN
500.0000 mL | Freq: Once | INTRAVENOUS | Status: AC
  Administered 2018-10-04: 500 mL via INTRAVENOUS

## 2018-10-04 MED ORDER — EPHEDRINE 5 MG/ML INJ
10.0000 mg | INTRAVENOUS | Status: DC | PRN
  Filled 2018-10-04: qty 2

## 2018-10-04 MED ORDER — MISOPROSTOL 50MCG HALF TABLET
50.0000 ug | ORAL_TABLET | ORAL | Status: DC | PRN
  Administered 2018-10-04 (×2): 50 ug via ORAL
  Filled 2018-10-04 (×3): qty 1

## 2018-10-04 MED ORDER — OXYTOCIN 40 UNITS IN LACTATED RINGERS INFUSION - SIMPLE MED
2.5000 [IU]/h | INTRAVENOUS | Status: DC
  Filled 2018-10-04: qty 1000

## 2018-10-04 MED ORDER — LACTATED RINGERS IV SOLN: 500.0000 mL | INTRAVENOUS | Status: DC | PRN

## 2018-10-04 MED ORDER — FENTANYL 2.5 MCG/ML BUPIVACAINE 1/10 % EPIDURAL INFUSION (WH - ANES)
14.0000 mL/h | INTRAMUSCULAR | Status: DC | PRN
  Administered 2018-10-04 – 2018-10-05 (×2): 14 mL/h via EPIDURAL
  Filled 2018-10-04 (×2): qty 100

## 2018-10-04 MED ORDER — SOD CITRATE-CITRIC ACID 500-334 MG/5ML PO SOLN
30.0000 mL | ORAL | Status: DC | PRN
  Administered 2018-10-05 – 2018-10-06 (×2): 30 mL via ORAL
  Filled 2018-10-04 (×2): qty 15

## 2018-10-04 MED ORDER — FENTANYL CITRATE (PF) 100 MCG/2ML IJ SOLN
100.0000 ug | INTRAMUSCULAR | Status: DC | PRN
  Administered 2018-10-04 (×3): 100 ug via INTRAVENOUS
  Filled 2018-10-04 (×3): qty 2

## 2018-10-04 MED ORDER — LIDOCAINE HCL (PF) 1 % IJ SOLN
INTRAMUSCULAR | Status: DC | PRN
  Administered 2018-10-04 (×2): 4 mL via EPIDURAL

## 2018-10-04 MED ORDER — OXYTOCIN 40 UNITS IN LACTATED RINGERS INFUSION - SIMPLE MED
1.0000 m[IU]/min | INTRAVENOUS | Status: DC
  Administered 2018-10-04: 2 m[IU]/min via INTRAVENOUS

## 2018-10-04 MED ORDER — LACTATED RINGERS IV SOLN
INTRAVENOUS | Status: DC
  Administered 2018-10-04 – 2018-10-05 (×4): via INTRAVENOUS

## 2018-10-04 MED ORDER — ACETAMINOPHEN 325 MG PO TABS: 650.0000 mg | ORAL_TABLET | ORAL | Status: DC | PRN

## 2018-10-04 MED ORDER — ONDANSETRON HCL 4 MG/2ML IJ SOLN: 4.0000 mg | Freq: Four times a day (QID) | INTRAMUSCULAR | Status: DC | PRN

## 2018-10-04 MED ORDER — PHENYLEPHRINE 40 MCG/ML (10ML) SYRINGE FOR IV PUSH (FOR BLOOD PRESSURE SUPPORT)
80.0000 ug | PREFILLED_SYRINGE | INTRAVENOUS | Status: DC | PRN
  Filled 2018-10-04: qty 5

## 2018-10-04 MED ORDER — OXYTOCIN BOLUS FROM INFUSION
500.0000 mL | Freq: Once | INTRAVENOUS | Status: AC
  Administered 2018-10-05: 500 mL via INTRAVENOUS

## 2018-10-04 MED ORDER — LACTATED RINGERS IV SOLN: 500.0000 mL | Freq: Once | INTRAVENOUS | Status: DC

## 2018-10-04 MED ORDER — OXYCODONE-ACETAMINOPHEN 5-325 MG PO TABS
1.0000 | ORAL_TABLET | ORAL | Status: DC | PRN
  Administered 2018-10-05 (×4): 1 via ORAL
  Filled 2018-10-04 (×4): qty 1

## 2018-10-04 NOTE — Progress Notes (Signed)
LABOR PROGRESS NOTE  Carla Munoz is a 27 y.o. G1P0000 at [redacted]w[redacted]d  admitted for IOL for oligohydramnios.   Subjective: Starting to feel more uncomfortable.  Objective: BP 112/71   Pulse 90   Temp 97.7 F (36.5 C) (Oral)   Resp 18   Ht 5\' 8"  (1.727 m)   Wt 80 kg   BMI 26.82 kg/m  or  Vitals:   10/04/18 1901 10/04/18 1906 10/04/18 1923 10/04/18 2016  BP:   112/71   Pulse:   90   Resp: 18 20 20 18   Temp:   97.7 F (36.5 C)   TempSrc:   Oral   Weight:      Height:        Dilation: 3 Effacement (%): 50 Cervical Position: Posterior Station: -3 Presentation: Vertex Exam by:: Dr Robinette Haines: baseline rate 130, moderate varibility, +acel, no decel Toco: q3-5 min   Labs: Lab Results  Component Value Date   WBC 10.3 10/04/2018   HGB 9.9 (L) 10/04/2018   HCT 30.6 (L) 10/04/2018   MCV 87.7 10/04/2018   PLT 310 10/04/2018    Patient Active Problem List   Diagnosis Date Noted  . Supervision of pregnancy with insufficient antenatal care 10/04/2018  . Anemia, antepartum 10/04/2018  . Tobacco smoking affecting pregnancy, antepartum 10/04/2018  . Uterine size date discrepancy, antepartum 10/04/2018  . Seizure disorder during pregnancy, antepartum ([place]) 10/04/2018  . History of syncope 10/04/2018  . AFI (amniotic fluid index) borderline low 10/04/2018  . Oligohydramnios 10/04/2018  . 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 G1P0000 at [redacted]w[redacted]d here for IOL for oligohydramnios.   Labor: Induction. Patient with SROM @2015  with clear fluid. Good cervical progression s/p cytotec x2. Will start Pitocin 2x2.  Fetal Wellbeing:  Cat I  Pain Control:  Epidural upon maternal request  Anticipated MOD:  NSVD   Phill Myron, D.O. OB Fellow  10/04/2018, 8:38 PM

## 2018-10-04 NOTE — Anesthesia Preprocedure Evaluation (Signed)
Anesthesia Evaluation  Patient identified by MRN, date of birth, ID band Patient awake    Reviewed: Allergy & Precautions, Patient's Chart, lab work & pertinent test results  Airway Mallampati: II  TM Distance: >3 FB Neck ROM: Full    Dental no notable dental hx. (+) Teeth Intact   Pulmonary former smoker,    Pulmonary exam normal breath sounds clear to auscultation       Cardiovascular negative cardio ROS Normal cardiovascular exam Rhythm:Regular Rate:Normal     Neuro/Psych Seizures -, Well Controlled,  PSYCHIATRIC DISORDERS Depression Bipolar Disorder Hx/o Brain tumor S/P resection    GI/Hepatic Neg liver ROS, GERD  ,  Endo/Other  negative endocrine ROS  Renal/GU negative Renal ROS     Musculoskeletal negative musculoskeletal ROS (+)   Abdominal   Peds  Hematology  (+) anemia ,   Anesthesia Other Findings   Reproductive/Obstetrics (+) Pregnancy                             Anesthesia Physical Anesthesia Plan  ASA: II  Anesthesia Plan: Epidural   Post-op Pain Management:    Induction:   PONV Risk Score and Plan:   Airway Management Planned: Natural Airway  Additional Equipment:   Intra-op Plan:   Post-operative Plan:   Informed Consent: I have reviewed the patients History and Physical, chart, labs and discussed the procedure including the risks, benefits and alternatives for the proposed anesthesia with the patient or authorized representative who has indicated his/her understanding and acceptance.     Plan Discussed with: Anesthesiologist  Anesthesia Plan Comments:         Anesthesia Quick Evaluation

## 2018-10-04 NOTE — Anesthesia Pain Management Evaluation Note (Signed)
  CRNA Pain Management Visit Note  Patient: Carla Munoz, 27 y.o., female  "Hello I am a member of the anesthesia team at [facility]. We have an anesthesia team available at all times to provide care throughout the hospital, including epidural management and anesthesia for C-section. I don't know your plan for the delivery whether it a natural birth, water birth, IV sedation, nitrous supplementation, doula or epidural, but we want to meet your pain goals."   1.Was your pain managed to your expectations on prior hospitalizations?   No prior hospitalizations  2.What is your expectation for pain management during this hospitalization?     Epidural and IV pain meds  3.How can we help you reach that goal? Pain medication/epidural when desired  Record the patient's initial score and the patient's pain goal.   Pain: 9  Pain Goal: 7 [facility] wants you to be able to say your pain was always managed very well.  Aariz Maish 10/04/2018

## 2018-10-04 NOTE — Progress Notes (Signed)
  Subjective:    Carla Munoz is being seen today for her first obstetrical visit. Pt was prev followed in [place] by Gen OB/GYN and MFM. Pt reports normal prenatal care. She reports +FM, No Ctx and no LOF. She reports that she had always planned to deliver at [facility] After review of pts records it was determined that pt has oligohydramnios and a sz disorder and was recommended by MFM to deliver by 39 weeks.      Review of Systems:   Review of Systems  Objective:     BP 113/81   Pulse (!) 107   Wt 175 lb 8 oz (79.6 kg)   BMI 26.68 kg/m  Physical Exam  Exam  CONSTITUTIONAL: Well-developed, well-nourished female in no acute distress.  HENT:  Normocephalic, atraumatic EYES: Conjunctivae and EOM are normal. No scleral icterus.  NECK: Normal range of motion SKIN: Skin is warm and dry. No rash noted. Not diaphoretic.No pallor. [name]: Alert and oriented to person, place, and time. Normal coordination.     Assessment:  Per MFM consult on 09/01/2018 rec delivery at 75 weeks or before ([name])   Pregnancy: G1P0 Patient Active Problem List   Diagnosis Date Noted  . 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  Greater than 50% was spent in counseling and coordination of care with the patient.     Lavonia Drafts 10/04/2018

## 2018-10-04 NOTE — Anesthesia Procedure Notes (Signed)
Epidural Patient location during procedure: OB Start time: 10/04/2018 8:59 PM End time: 10/04/2018 9:05 PM  Staffing Anesthesiologist: Josephine Igo, MD Performed: anesthesiologist   Preanesthetic Checklist Completed: patient identified, site marked, surgical consent, pre-op evaluation, timeout performed, IV checked, risks and benefits discussed and monitors and equipment checked  Epidural Patient position: sitting Prep: site prepped and draped and DuraPrep Patient monitoring: continuous pulse ox and blood pressure Approach: midline Location: L3-L4 Injection technique: LOR air  Needle:  Needle type: Tuohy  Needle gauge: 17 G Needle length: 9 cm and 9 Needle insertion depth: 5 cm cm Catheter type: closed end flexible Catheter size: 19 Gauge Catheter at skin depth: 10 cm Test dose: negative and Other  Assessment Events: blood not aspirated, injection not painful, no injection resistance, negative IV test and no paresthesia  Additional Notes Patient identified. Risks and benefits discussed including failed block, incomplete  Pain control, post dural puncture headache, nerve damage, paralysis, blood pressure Changes, nausea, vomiting, reactions to medications-both toxic and allergic and post Partum back pain. All questions were answered. Patient expressed understanding and wished to proceed. Sterile technique was used throughout procedure. Epidural site was Dressed with sterile barrier dressing. No paresthesias, signs of intravascular injection Or signs of intrathecal spread were encountered.  Patient was more comfortable after the epidural was dosed. Please see RN's note for documentation of vital signs and FHR which are stable.

## 2018-10-04 NOTE — H&P (Signed)
LABOR AND DELIVERY ADMISSION HISTORY AND PHYSICAL NOTE  Carla Munoz is a 27 y.o. female G1P0000 with IUP at [redacted]w[redacted]d by ultrasound presenting for IOL for oligohydramnios. Patient received care at [facility] in [place] area. Transferred to [facility] for initial [facility] visit today and sono from 10/2 noted to have AFI of 6. She was admitted for IOL for oligohydramnios.  She reports positive fetal movement. She denies leakage of fluid or vaginal bleeding.  Prenatal History/Complications: PNC at [facility]. Initial PNC on 10/22 at [facility].  Pregnancy complications:  - Oligohydramnios, AFI 6 on sono on 10/22  - IVF pregnancy  - seizure disorder on Keppra, last seizure on 10/2  - h/o severe depression and bipolar disorder  - h/o THC use (+UDS 04/04/18)  - UTI during pregnancy  - +trichomonas 10/2, unsure if treated  - anemia, on Fe supplements  - odontogenic tumor   Past Medical History: Past Medical History:  Diagnosis Date  . Anemia   . Bipolar 1 disorder with moderate mania ([place])   . Brain tumor ([place])   . Seizures ([place])     Past Surgical History: Past Surgical History:  Procedure Laterality Date  . BRAIN SURGERY    . MOUTH SURGERY      Obstetrical History: OB History    Gravida  1   Para  0   Term  0   Preterm  0   AB  0   Living  0     SAB  0   TAB  0   Ectopic  0   Multiple  0   Live Births  0           Social History: Social History   Socioeconomic History  . Marital status: Single    Spouse name: Not on file  . Number of children: Not on file  . Years of education: Not on file  . Highest education level: Not on file  Occupational History  . Not on file  Social Needs  . Financial resource strain: Not on file  . Food insecurity:    Worry: Not on file    Inability: Not on file  . Transportation needs:    Medical: Not on file    Non-medical: Not on file  Tobacco Use  . Smoking status: Former Smoker    Packs/day: 0.50    Types: Cigarettes  . Smokeless tobacco:  Never Used  Substance and Sexual Activity  . Alcohol use: Not Currently    Comment: socially  . Drug use: No  . Sexual activity: Yes    Birth control/protection: None  Lifestyle  . Physical activity:    Days per week: Not on file    Minutes per session: Not on file  . Stress: Not on file  Relationships  . Social connections:    Talks on phone: Not on file    Gets together: Not on file    Attends religious service: Not on file    Active member of club or organization: Not on file    Attends meetings of clubs or organizations: Not on file    Relationship status: Not on file  Other Topics Concern  . Not on file  Social History Narrative  . Not on file    Family History: History reviewed. No pertinent family history.  Allergies: Allergies  Allergen Reactions  . Banana Swelling    Lips swelling   . Chocolate Hives  . Kiwi Extract Swelling    Lips swelling   .  Lactose Intolerance (Gi) Diarrhea  . Orange Fruit [Citrus] Hives    hives  . Other Other (See Comments)    Walnuts- cant breathe     Medications Prior to Admission  Medication Sig Dispense Refill Last Dose  . iron polysaccharides (NIFEREX) 150 MG capsule Take 1 capsule (150 mg total) by mouth daily. (Patient not taking: Reported on 10/04/2018) 30 capsule 0 Not Taking  . levETIRAcetam (KEPPRA) 500 MG tablet Take 2 tablets (1,000 mg total) by mouth at bedtime.   Taking  . Prenatal Vit-Fe Fumarate-FA (PRENATAL MULTIVITAMIN) TABS tablet Take 1 tablet by mouth daily at 12 noon.   Taking     Review of Systems  All systems reviewed and negative except as stated in HPI  Physical Exam Blood pressure 124/87, pulse 97, temperature 98 F (36.7 C), temperature source Oral, resp. rate 18, height 5\' 8"  (1.727 m), weight 80 kg. General appearance: alert, oriented, NAD Lungs: normal respiratory effort Heart: regular rate Abdomen: soft, non-tender; gravid, FH appropriate for GA Extremities: No calf swelling or  tenderness Presentation: cephalic Fetal monitoring: 150 bpm, moderate variability, +acels, no decels  Uterine activity: q3-4 minutes with irritability  Dilation: Fingertip Effacement (%): Thick Station: -3 Exam by:: Dr. Juleen China  Prenatal labs: ABO, Rh: --/--/O POS (10/22 1142) Antibody: NEG (10/22 1142) Rubella:  Immune  RPR:  Non-reactive HBsAg:   Negative  HIV:   Negative  GC/Chlamydia: Negative  GBS: Negative (10/20 0000)  2-hr GTT: Normal  Genetic screening:  Declined  Anatomy US: Normal   Prenatal Transfer Tool  Maternal Diabetes: No Genetic Screening: Normal Maternal Ultrasounds/Referrals: Normal Fetal Ultrasounds or other Referrals:  None Maternal Substance Abuse:  Yes:  Type: Marijuana Significant Maternal Medications:  Meds include: Other: Keppra  Significant Maternal Lab Results: Lab values include: Group B Strep negative  Results for orders placed or performed during the hospital encounter of 10/04/18 (from the past 24 hour(s))  CBC   Collection Time: 10/04/18 11:42 AM  Result Value Ref Range   WBC 10.3 4.0 - 10.5 K/uL   RBC 3.49 (L) 3.87 - 5.11 MIL/uL   Hemoglobin 9.9 (L) 12.0 - 15.0 g/dL   HCT 30.6 (L) 36.0 - 46.0 %   MCV 87.7 80.0 - 100.0 fL   MCH 28.4 26.0 - 34.0 pg   MCHC 32.4 30.0 - 36.0 g/dL   RDW 12.7 11.5 - 15.5 %   Platelets 310 150 - 400 K/uL  Type and screen [place]   Collection Time: 10/04/18 11:42 AM  Result Value Ref Range   ABO/RH(D) O POS    Antibody Screen NEG    Sample Expiration      10/07/2018 Performed at [facility], [address] [place], [place] [address]   Wet prep, genital   Collection Time: 10/04/18 12:48 PM  Result Value Ref Range   Yeast Wet Prep HPF POC NONE SEEN NONE SEEN   Trich, Wet Prep NONE SEEN NONE SEEN   Clue Cells Wet Prep HPF POC NONE SEEN NONE SEEN   WBC, Wet Prep HPF POC FEW (A) NONE SEEN   Sperm NONE SEEN   Results for orders placed or performed in visit on 10/04/18 (from  the past 24 hour(s))  POCT urinalysis dip (device)   Collection Time: 10/04/18 10:25 AM  Result Value Ref Range   Glucose, UA NEGATIVE NEGATIVE mg/dL   Bilirubin Urine NEGATIVE NEGATIVE   Ketones, ur NEGATIVE NEGATIVE mg/dL   Specific Gravity, Urine 1.015 1.005 -  1.030   Hgb urine dipstick NEGATIVE NEGATIVE   pH 6.5 5.0 - 8.0   Protein, ur NEGATIVE NEGATIVE mg/dL   Urobilinogen, UA 0.2 0.0 - 1.0 mg/dL   Nitrite NEGATIVE NEGATIVE   Leukocytes, UA TRACE (A) NEGATIVE    Patient Active Problem List   Diagnosis Date Noted  . Supervision of pregnancy with insufficient antenatal care 10/04/2018  . Anemia, antepartum 10/04/2018  . Tobacco smoking affecting pregnancy, antepartum 10/04/2018  . Uterine size date discrepancy, antepartum 10/04/2018  . Seizure disorder during pregnancy, antepartum ([place]) 10/04/2018  . History of syncope 10/04/2018  . AFI (amniotic fluid index) borderline low 10/04/2018  . Oligohydramnios 10/04/2018  . 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 G1P0000 at [redacted]w[redacted]d here for IOL for oligohydramnios.   #Labor: Induction. Start with cytotec 50 mg PO q4h prn. Plan for FB when cervix is more favorable.  #Pain: Epidural upon maternal request  #FWB: Cat I  #ID:  GBS neg. Obtain wet prep due to ?untreated trichomonas.  #MOF: Breast  #MOC: Patient declines.  #Circ:  N/A   Melina Schools 10/04/2018, 1:25 PM

## 2018-10-04 NOTE — BH Specialist Note (Signed)
Integrated Behavioral Health Initial Visit  MRN: [id number] Name: Carla Munoz  Number of [place] Clinician visits:: 1/6 Session Start time: 10:50 Session End time: 11:00 Total time: 15 minutes  Type of Service: [place] Interpretor:No. Interpretor Name and Language: n/a   Warm Hand Off Completed.       SUBJECTIVE: Carla Munoz is a 27 y.o. female accompanied by Partner/Significant Other Patient was referred by Teryl Lucy for Initial OB introduction to integrated behavioral health services And history of depressive disorder. Patient reports the following symptoms/concerns: Pt states her concern today is contractions, no other concern today.  Duration of problem: Today; Severity of problem: mild  OBJECTIVE: Mood: Normal and Affect: Appropriate Risk of harm to self or others: No plan to harm self or others  LIFE CONTEXT: Family and Social: Pt lives with her partner and partner's 3yo  School/Work: - Self-Care: - Life Changes: Current pregnancy; move to [place] area  GOALS ADDRESSED:n/a  INTERVENTIONS: Interventions utilized: Psychoeducation and/or Health Education  Standardized Assessments completed: GAD-7 and PHQ 9  ASSESSMENT: Patient currently experiencing Supervision of pregnancy with insufficient antenatal care   Patient may benefit from Initial OB introduction to integrated behavioral health services And psychoeducation regarding coping with symptoms of depression and anxiety.  PLAN: 1. Follow up with behavioral health clinician on : Postpartum visit, or earlier, if needed 2. Behavioral recommendations:  -Go to MAU, will be induced today -Read educational materials regarding coping with symptoms of depression and anxiety  3. Referral(s): [place] (In Clinic) 4. "From scale of 1-10, how likely are you to follow plan?": 10  Garlan Fair,  LCSW  Depression screen [facility] 2/9 10/04/2018  Decreased Interest 2  Down, Depressed, Hopeless 2  PHQ - 2 Score 4  Altered sleeping 3  Tired, decreased energy 3  Change in appetite 1  Feeling bad or failure about yourself  2  Trouble concentrating 1  Moving slowly or fidgety/restless 0  Suicidal thoughts 0  PHQ-9 Score 14   GAD 7 : Generalized Anxiety Score 10/04/2018  Nervous, Anxious, on Edge 3  Control/stop worrying 2  Worry too much - different things 2  Trouble relaxing 3  Restless 1  Easily annoyed or irritable 3  Afraid - awful might happen 1  Total GAD 7 Score 15

## 2018-10-04 NOTE — Progress Notes (Signed)
LABOR PROGRESS NOTE  Carla Munoz is a 27 y.o. G1P0000 at [redacted]w[redacted]d  admitted for IOL for oligohydramnios.   Subjective: Strip note.   Objective: BP 123/81   Pulse 100   Temp 98.2 F (36.8 C) (Oral)   Resp 20   Ht 5\' 8"  (1.727 m)   Wt 80 kg   BMI 26.82 kg/m  or  Vitals:   10/04/18 1618 10/04/18 1623 10/04/18 1636 10/04/18 1645  BP:   123/81   Pulse:   100   Resp: 18 20 18 20   Temp:   98.2 F (36.8 C)   TempSrc:   Oral   Weight:      Height:        Dilation: Fingertip Effacement (%): Thick Cervical Position: Posterior Station: -3 Presentation: Vertex Exam by:: Philis Pique, RN FHT: baseline rate 150, moderate varibility, 10x10 acel, no decel Toco: q2-5 min   Labs: Lab Results  Component Value Date   WBC 10.3 10/04/2018   HGB 9.9 (L) 10/04/2018   HCT 30.6 (L) 10/04/2018   MCV 87.7 10/04/2018   PLT 310 10/04/2018    Patient Active Problem List   Diagnosis Date Noted  . Supervision of pregnancy with insufficient antenatal care 10/04/2018  . Anemia, antepartum 10/04/2018  . Tobacco smoking affecting pregnancy, antepartum 10/04/2018  . Uterine size date discrepancy, antepartum 10/04/2018  . Seizure disorder during pregnancy, antepartum ([place]) 10/04/2018  . History of syncope 10/04/2018  . AFI (amniotic fluid index) borderline low 10/04/2018  . Oligohydramnios 10/04/2018  . 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 G1P0000 at [redacted]w[redacted]d here for IOL for oligohydramnios.   Labor: Induction. Cervix remains fingertip. Will give 2nd cytotec. Plan for FB when cervix more favorable.  Fetal Wellbeing:  Cat I  Pain Control:  Epidural when further in labor course.  Anticipated MOD:  NSVD   Phill Myron, D.O. OB Fellow  10/04/2018, 4:52 PM

## 2018-10-05 DIAGNOSIS — Z3A41 41 weeks gestation of pregnancy: Secondary | ICD-10-CM

## 2018-10-05 DIAGNOSIS — O480 Post-term pregnancy: Secondary | ICD-10-CM

## 2018-10-05 DIAGNOSIS — O4103X0 Oligohydramnios, third trimester, not applicable or unspecified: Secondary | ICD-10-CM

## 2018-10-05 LAB — GC/CHLAMYDIA PROBE AMP (~~LOC~~) NOT AT ARMC
CHLAMYDIA, DNA PROBE: NEGATIVE
NEISSERIA GONORRHEA: NEGATIVE

## 2018-10-05 LAB — ABO/RH: ABO/RH(D): O POS

## 2018-10-05 LAB — SYPHILIS: RPR W/REFLEX TO RPR TITER AND TREPONEMAL ANTIBODIES, TRADITIONAL SCREENING AND DIAGNOSIS ALGORITHM: RPR Ser Ql: NONREACTIVE

## 2018-10-05 MED ORDER — IBUPROFEN 600 MG PO TABS
600.0000 mg | ORAL_TABLET | Freq: Four times a day (QID) | ORAL | Status: DC
  Administered 2018-10-05 – 2018-10-07 (×9): 600 mg via ORAL
  Filled 2018-10-05 (×8): qty 1

## 2018-10-05 MED ORDER — MEASLES, MUMPS & RUBELLA VAC ~~LOC~~ INJ
0.5000 mL | INJECTION | Freq: Once | SUBCUTANEOUS | Status: DC
  Filled 2018-10-05: qty 0.5

## 2018-10-05 MED ORDER — DIBUCAINE 1 % RE OINT: 1.0000 "application " | TOPICAL_OINTMENT | RECTAL | Status: DC | PRN

## 2018-10-05 MED ORDER — PRENATAL MULTIVITAMIN CH
1.0000 | ORAL_TABLET | Freq: Every day | ORAL | Status: DC
  Administered 2018-10-05 – 2018-10-06 (×2): 1 via ORAL
  Filled 2018-10-05 (×2): qty 1

## 2018-10-05 MED ORDER — COCONUT OIL OIL: 1.0000 "application " | TOPICAL_OIL | Status: DC | PRN

## 2018-10-05 MED ORDER — TETANUS-DIPHTH-ACELL PERTUSSIS 5-2.5-18.5 LF-MCG/0.5 IM SUSP: 0.5000 mL | Freq: Once | INTRAMUSCULAR | Status: DC

## 2018-10-05 MED ORDER — ACETAMINOPHEN 325 MG PO TABS: 650.0000 mg | ORAL_TABLET | ORAL | Status: DC | PRN

## 2018-10-05 MED ORDER — ZOLPIDEM TARTRATE 5 MG PO TABS: 5.0000 mg | ORAL_TABLET | Freq: Every evening | ORAL | Status: DC | PRN

## 2018-10-05 MED ORDER — ONDANSETRON HCL 4 MG/2ML IJ SOLN: 4.0000 mg | INTRAMUSCULAR | Status: DC | PRN

## 2018-10-05 MED ORDER — BENZOCAINE-MENTHOL 20-0.5 % EX AERO
1.0000 "application " | INHALATION_SPRAY | CUTANEOUS | Status: DC | PRN
  Administered 2018-10-05: 1 via TOPICAL
  Filled 2018-10-05: qty 56

## 2018-10-05 MED ORDER — LORAZEPAM 2 MG/ML IJ SOLN
2.0000 mg | INTRAMUSCULAR | Status: DC | PRN
  Filled 2018-10-05: qty 1

## 2018-10-05 MED ORDER — ONDANSETRON HCL 4 MG PO TABS
4.0000 mg | ORAL_TABLET | ORAL | Status: DC | PRN
  Administered 2018-10-06: 4 mg via ORAL
  Filled 2018-10-05: qty 1

## 2018-10-05 MED ORDER — WITCH HAZEL-GLYCERIN EX PADS: 1.0000 "application " | MEDICATED_PAD | CUTANEOUS | Status: DC | PRN

## 2018-10-05 MED ORDER — SIMETHICONE 80 MG PO CHEW
80.0000 mg | CHEWABLE_TABLET | ORAL | Status: DC | PRN
  Administered 2018-10-06: 80 mg via ORAL
  Filled 2018-10-05: qty 1

## 2018-10-05 MED ORDER — INFLUENZA VAC SPLIT QUAD 0.5 ML IM SUSY
0.5000 mL | PREFILLED_SYRINGE | INTRAMUSCULAR | Status: AC
  Administered 2018-10-06: 0.5 mL via INTRAMUSCULAR
  Filled 2018-10-05: qty 0.5

## 2018-10-05 MED ORDER — SENNOSIDES-DOCUSATE SODIUM 8.6-50 MG PO TABS
2.0000 | ORAL_TABLET | ORAL | Status: DC
  Administered 2018-10-06 (×2): 2 via ORAL
  Filled 2018-10-05 (×2): qty 2

## 2018-10-05 MED ORDER — DIPHENHYDRAMINE HCL 25 MG PO CAPS: 25.0000 mg | ORAL_CAPSULE | Freq: Four times a day (QID) | ORAL | Status: DC | PRN

## 2018-10-05 NOTE — Anesthesia Postprocedure Evaluation (Signed)
Anesthesia Post Note  Patient: Carla Munoz  Procedure(s) Performed: AN AD HOC LABOR EPIDURAL     Patient location during evaluation: Mother Baby Anesthesia Type: Epidural Level of consciousness: awake and alert Pain management: pain level controlled Vital Signs Assessment: post-procedure vital signs reviewed and stable Respiratory status: spontaneous breathing, nonlabored ventilation and respiratory function stable Cardiovascular status: stable Postop Assessment: no headache, no backache, epidural receding, able to ambulate, adequate PO intake, no apparent nausea or vomiting and patient able to bend at knees Anesthetic complications: no    Last Vitals:  Vitals:   10/05/18 0540 10/05/18 0647  BP: 123/79 115/70  Pulse: 99 86  Resp: 20 18  Temp: 36.7 C 36.7 C  SpO2: 99% 100%    Last Pain:  Vitals:   10/05/18 0647  TempSrc: Oral  PainSc:    Pain Goal:                 Jabier Mutton

## 2018-10-05 NOTE — Progress Notes (Signed)
Patient requested IV Ativan. MD called and did not want to give patient Ativan and said she could take her Keppra early tonight to help with her epilepsy. Vital signs stable and pain medication given upon request from patient. Seizure precautions implemented. Will continue to monitor.

## 2018-10-05 NOTE — Lactation Note (Signed)
This note was copied from a baby's chart. Lactation Consultation Note  Patient Name: Carla Munoz Today's Date: 10/05/2018 Reason for consult: Initial assessment;Primapara;1st time breastfeeding;Term  P1 mother whose infant is now 47 hour old  Baby was swaddled and sleeping in bassinet when I arrived.  She was not showing feeding cues.  Mother has been set up with a DEBP by RN to help increase milk supply.  She had just finished pumping but did not obtain any colostrum at this time.  Discussed hand expression and colostrum container provided.  Mother will hand express before/after feedings and feed back any EBM she obtains.  Encouraged to feed 8-12 times/24 hours or sooner if baby shows feeding cues.  Mother was familiar with feeding cues.    Offered to assist with latching and mother will call as needed.  Mom made aware of O/P services, breastfeeding support groups, community resources, and our phone # for post-discharge questions. Visitors present.   Maternal Data Formula Feeding for Exclusion: No Has patient been taught Hand Expression?: Yes Does the patient have breastfeeding experience prior to this delivery?: No  Feeding Feeding Type: Breast Milk  LATCH Score Latch: Too sleepy or reluctant, no latch achieved, no sucking elicited.                 Interventions Interventions: Breast feeding basics reviewed;Assisted with latch;Skin to skin;Breast massage;Hand express;Pre-pump if needed;Adjust position;Support pillows;Position options;Expressed milk;Hand pump;DEBP  Lactation Tools Discussed/Used Pump Review: Setup, frequency, and cleaning Initiated by:: Initiated by RN Date initiated:: 10/05/18   Consult Status Consult Status: Follow-up Date: 10/06/18 Follow-up type: In-patient    Eduardo Wurth R Rudie Sermons 10/05/2018, 11:03 AM

## 2018-10-06 MED ORDER — FERROUS SULFATE 325 (65 FE) MG PO TABS
325.0000 mg | ORAL_TABLET | Freq: Two times a day (BID) | ORAL | Status: DC
  Administered 2018-10-06 – 2018-10-07 (×2): 325 mg via ORAL
  Filled 2018-10-06 (×2): qty 1

## 2018-10-06 NOTE — Progress Notes (Signed)
POSTPARTUM PROGRESS NOTE  POD #1  Subjective:  Carla Munoz is a 27 y.o. G1P0000 SVD at [redacted]w[redacted]d.  She reports she doing well. No acute events overnight. She reports she is doing well. She denies any problems with ambulating, voiding or po intake. Denies nausea or vomiting. She has passed flatus, but no BMs. Pain is well controlled on ibuprofen.   Objective: Blood pressure 108/72, pulse 79, temperature 98.9 F (37.2 C), temperature source Oral, resp. rate 16, height 5\' 8"  (1.727 m), weight 80 kg, SpO2 98 %.  Physical Exam:  General: alert, cooperative and no distress Chest: no respiratory distress Heart:regular rate, distal pulses intact Abdomen: soft, nontender,  Uterine Fundus: firm, appropriately tender DVT Evaluation: No calf swelling or tenderness Extremities: Trace edema Skin: warm, dry  Recent Labs    10/04/18 1142  HGB 9.9*  HCT 30.6*    Assessment/Plan: Carla Munoz is a 27 y.o. G1P0000 s/p SVD at [redacted]w[redacted]d for IOL for oligohydramnios.  POD#1 - Doing welll; pain well controlled. H/H appropriate  Routine postpartum care  OOB, ambulated  Lovenox for VTE prophylaxis Anemia: asymptomatic  Start po ferrous sulfate BID Contraception: declines Feeding: Both breast and bottle  Dispo: Plan for discharge tomorrow.   LOS: 2 days   Ree Kida PA Student 7:37AM

## 2018-10-07 ENCOUNTER — Ambulatory Visit: Payer: Self-pay

## 2018-10-07 ENCOUNTER — Encounter: Payer: Self-pay | Admitting: Obstetrics & Gynecology

## 2018-10-07 MED ORDER — IBUPROFEN 600 MG PO TABS: 600.0000 mg | ORAL_TABLET | Freq: Four times a day (QID) | ORAL | 0 refills | Status: AC

## 2018-10-07 NOTE — Progress Notes (Signed)
CSW received consult due to score 14 on [place] Depression Screen.    CSW met with MOB via bedside to provide any support needed. MOB was tearful during assessment and stated she was anxious due to baby needing phototherapy. MOB states she is hopeful baby will be able to discharge home with her tomorrow however she is still feeling anxious regarding her current health. MOB was open about her current diagnosis of Bipolar 1 and stated she is very aware of her emotions. CSW went over [place] Depression Screen with MOB who stated she felt like that was her baseline. CSW encouraged MOB to reach out to supports/ OBGYN in the event she does not feel back to her baseline after the baby blues stage. MOB voiced understanding.  CSW provided education regarding Baby Blues vs PMADs and provided MOB with resources for mental health follow up.  CSW encouraged MOB to evaluate her mental health throughout the postpartum period with the use of the New Mom Checklist developed by Postpartum Progress as well as the [place] Postnatal Depression Scale and notify a medical professional if symptoms arise.    CSW informed MOB she would come back in the AM to check up on MOB and baby. No concerns at this time.   Kingsley Spittle, [place]  [phone]

## 2018-10-07 NOTE — Discharge Instructions (Signed)
Postpartum Care After Vaginal Delivery °The period of time right after you deliver your newborn is called the postpartum period. °What kind of medical care will I receive? °· You may continue to receive fluids and medicines through an IV tube inserted into one of your veins. °· If an incision was made near your vagina (episiotomy) or if you had some vaginal tearing during delivery, cold compresses may be placed on your episiotomy or your tear. This helps to reduce pain and swelling. °· You may be given a squirt bottle to use when you go to the bathroom. You may use this until you are comfortable wiping as usual. To use the squirt bottle, follow these steps: °? Before you urinate, fill the squirt bottle with warm water. Do not use hot water. °? After you urinate, while you are sitting on the toilet, use the squirt bottle to rinse the area around your urethra and vaginal opening. This rinses away any urine and blood. °? You may do this instead of wiping. As you start healing, you may use the squirt bottle before wiping yourself. Make sure to wipe gently. °? Fill the squirt bottle with clean water every time you use the bathroom. °· You will be given sanitary pads to wear. °How can I expect to feel? °· You may not feel the need to urinate for several hours after delivery. °· You will have some soreness and pain in your abdomen and vagina. °· If you are breastfeeding, you may have uterine contractions every time you breastfeed for up to several weeks postpartum. Uterine contractions help your uterus return to its normal size. °· It is normal to have vaginal bleeding (lochia) after delivery. The amount and appearance of lochia is often similar to a menstrual period in the first week after delivery. It will gradually decrease over the next few weeks to a dry, yellow-brown discharge. For most women, lochia stops completely by 6-8 weeks after delivery. Vaginal bleeding can vary from woman to woman. °· Within the first few  days after delivery, you may have breast engorgement. This is when your breasts feel heavy, full, and uncomfortable. Your breasts may also throb and feel hard, tightly stretched, warm, and tender. After this occurs, you may have milk leaking from your breasts. Your health care provider can help you relieve discomfort due to breast engorgement. Breast engorgement should go away within a few days. °· You may feel more sad or worried than normal due to hormonal changes after delivery. These feelings should not last more than a few days. If these feelings do not go away after several days, speak with your health care provider. °How should I care for myself? °· Tell your health care provider if you have pain or discomfort. °· Drink enough water to keep your urine clear or pale yellow. °· Wash your hands thoroughly with soap and water for at least 20 seconds after changing your sanitary pads, after using the toilet, and before holding or feeding your baby. °· If you are not breastfeeding, avoid touching your breasts a lot. Doing this can make your breasts produce more milk. °· If you become weak or lightheaded, or you feel like you might faint, ask for help before: °? Getting out of bed. °? Showering. °· Change your sanitary pads frequently. Watch for any changes in your flow, such as a sudden increase in volume, a change in color, the passing of large blood clots. If you pass a blood clot from your vagina, save it   to show to your health care provider. Do not flush blood clots down the toilet without having your health care provider look at them. °· Make sure that all your vaccinations are up to date. This can help protect you and your baby from getting certain diseases. You may need to have immunizations done before you leave the hospital. °· If desired, talk with your health care provider about methods of family planning or birth control (contraception). °How can I start bonding with my baby? °Spending as much time as  possible with your baby is very important. During this time, you and your baby can get to know each other and develop a bond. Having your baby stay with you in your room (rooming in) can give you time to get to know your baby. Rooming in can also help you become comfortable caring for your baby. Breastfeeding can also help you bond with your baby. °How can I plan for returning home with my baby? °· Make sure that you have a car seat installed in your vehicle. °? Your car seat should be checked by a certified car seat installer to make sure that it is installed safely. °? Make sure that your baby fits into the car seat safely. °· Ask your health care provider any questions you have about caring for yourself or your baby. Make sure that you are able to contact your health care provider with any questions after leaving the hospital. °This information is not intended to replace advice given to you by your health care provider. Make sure you discuss any questions you have with your health care provider. °Document Released: 09/27/2007 Document Revised: 05/04/2016 Document Reviewed: 11/04/2015 °Elsevier Interactive Patient Education © 2018 [employer] ° °

## 2018-10-07 NOTE — Discharge Summary (Signed)
OB Discharge Summary     Patient Name: Carla Munoz DOB: 02-Jan-1991 MRN: [id number]  Date of admission: 10/04/2018 Delivering MD: Nicolette Bang   Date of discharge: 10/07/2018  Admitting diagnosis: 40WKS INDUCTION  Intrauterine pregnancy: [redacted]w[redacted]d     Secondary diagnosis:  Active Problems:   Oligohydramnios  Additional problems: none     Discharge diagnosis: Term Pregnancy Delivered                                                                                                Post partum procedures:  Augmentation: Pitocin, Cytotec and Foley Balloon  Complications: None  Hospital course:  Induction of Labor With Vaginal Delivery   27 y.o. yo G1P0000 at [redacted]w[redacted]d was admitted to the hospital 10/04/2018 for induction of labor.  Indication for induction: oligohydramnios.  Patient had an uncomplicated labor course as follows: Membrane Rupture Time/Date: 8:15 PM ,10/04/2018   Intrapartum Procedures: Episiotomy: None [1]                                         Lacerations:  1st degree [2];Perineal [11]  Patient had delivery of a Viable infant.  Information for the patient's newborn:  Orabelle, Rylee Girl Cheyeanne [[id number]]  Delivery Method: Vaginal, Spontaneous(Filed from Delivery Summary)   10/05/2018  Details of delivery can be found in separate delivery note.  Patient had a routine postpartum course. Patient is discharged home 10/07/18.  Physical exam  Vitals:   10/06/18 0545 10/06/18 1330 10/06/18 2309 10/07/18 0600  BP: 108/72 119/83 112/72 116/76  Pulse: 79 80 89 76  Resp: 16 16 17 16   Temp: 98.9 F (37.2 C) 97.8 F (36.6 C) 97.7 F (36.5 C) 98.2 F (36.8 C)  TempSrc: Oral  Oral Oral  SpO2:   100%   Weight:      Height:       General: alert, cooperative and no distress Lochia: appropriate Uterine Fundus: firm Incision: N/A DVT Evaluation: No evidence of DVT seen on physical exam. Negative Homan's sign. No cords or calf tenderness. Labs: Lab Results   Component Value Date   WBC 10.3 10/04/2018   HGB 9.9 (L) 10/04/2018   HCT 30.6 (L) 10/04/2018   MCV 87.7 10/04/2018   PLT 310 10/04/2018   CMP Latest Ref Rng & Units 01/19/2016  Glucose 65 - 99 mg/dL 82  BUN 6 - 20 mg/dL 10  Creatinine 0.44 - 1.00 mg/dL 0.64  Sodium 135 - 145 mmol/L 137  Potassium 3.5 - 5.1 mmol/L 4.2  Chloride 101 - 111 mmol/L 104  CO2 22 - 32 mmol/L 24  Calcium 8.9 - 10.3 mg/dL 9.2  Total Protein 6.0 - 8.3 g/dL -  Total Bilirubin 0.3 - 1.2 mg/dL -  Alkaline Phos 39 - 117 U/L -  AST 0 - 37 U/L -  ALT 0 - 35 U/L -    Discharge instruction: per After Visit Summary and "Baby and Me Booklet".  After visit meds:  Allergies as of 10/07/2018  Reactions   Banana Swelling   Lips swelling    Chocolate Hives   Kiwi Extract Swelling   Lips swelling    Lactose Intolerance (gi) Diarrhea   Orange Fruit [citrus] Hives   hives   Other Other (See Comments)   Walnuts- cant breathe       Medication List    STOP taking these medications   acetaminophen 500 MG tablet Commonly known as:  TYLENOL   diphenhydrAMINE 25 MG tablet Commonly known as:  BENADRYL   folic acid 1 MG tablet Commonly known as:  FOLVITE   iron polysaccharides 150 MG capsule Commonly known as:  NIFEREX     TAKE these medications   ibuprofen 600 MG tablet Commonly known as:  ADVIL,MOTRIN Take 1 tablet (600 mg total) by mouth every 6 (six) hours.   levETIRAcetam 500 MG tablet Commonly known as:  KEPPRA Take 2 tablets (1,000 mg total) by mouth at bedtime. What changed:  how much to take   prenatal multivitamin Tabs tablet Take 1 tablet by mouth daily at 12 noon.       Diet: routine diet  Activity: Advance as tolerated. Pelvic rest for 6 weeks.   Outpatient follow up: Follow up Appt: Future Appointments  Date Time Provider [place]  11/02/2018  4:20 PM Laury Deep, CNM [place]   Follow up Visit:No follow-ups on file.  Postpartum contraception:  None  Newborn Data: Live born female  Birth Weight: 7 lb 12 oz (3515 g) APGAR: 7, 9  Newborn Delivery   Birth date/time:  10/05/2018 03:36:00 Delivery type:  Vaginal, Spontaneous     Baby Feeding: Breast Disposition:home with mother   10/07/2018 Christin Fudge, CNM

## 2018-10-07 NOTE — Lactation Note (Signed)
This note was copied from a baby's chart. Lactation Consultation Note  Patient Name: Carla Munoz YOMAY'O Date: 10/07/2018 Reason for consult: Follow-up assessment;Difficult latch;1st time breastfeeding;Term;Hyperbilirubinemia   Follow up with mom of 14 hour old infant. Infant with 8 bottles of formula of 40-55 cc, EBM x 5 of 5-14 ml, 9 voids and 7 stools in the last 24 hours. Infant under double phototherapy.   Mom reports she is pumping every 3 hours and offering EBM and follows with formula. Mom reports she is unable to get infant to latch.. Mom is planning to call out for feeding assistance at 2 pm. Mom reports her milk supply is increasing, she feels heavier and denies engorgement.   Enc mom to call out at next feeding for assistance with latch. Mom reports she has no further questions/concerns at this time.    Maternal Data Formula Feeding for Exclusion: Yes Reason for exclusion: Mother's choice to formula and breast feed on admission Has patient been taught Hand Expression?: Yes Does the patient have breastfeeding experience prior to this delivery?: No  Feeding Feeding Type: Breast Milk with Formula added Nipple Type: Slow - flow  LATCH Score                   Interventions    Lactation Tools Discussed/Used Initiated by:: Reviewed and encouraged about every 2-3 hours and follow with hand expression   Consult Status Consult Status: Follow-up Date: 10/08/18 Follow-up type: In-patient    Debby Freiberg Denzal Meir 10/07/2018, 11:53 AM

## 2018-10-08 ENCOUNTER — Ambulatory Visit: Payer: Self-pay

## 2018-10-08 NOTE — Lactation Note (Signed)
This note was copied from a baby's chart. Lactation Consultation Note  Patient Name: Carla Munoz WUXLK'G Date: 10/08/2018 Reason for consult: Follow-up assessment;1st time breastfeeding;Primapara;Term  P1 mother whose infant is now 79 hours old  Baby is under double phototherapy.  Baby had recently formula fed.  Mother stated that "she won't stay latched."  Mother's breasts are full and nipples are short shafted bilaterally.  Mother stated that baby took 15 mls of EBM and 62 mls of formula.  I asked mother if she was putting baby to the breast before supplementing and she stated, "Yes."  When asked how often she was pumping mother could not reliably remember.  Baby was on her chest and showing feeding cues so I offered to assist with latching)  Assisted baby to latch in the football hold on the left breast.  She would latch but did not want to suck.  Burped her and tried again with the same results.  She was restless at the breast and would not feed.  Suggested mother try the cross cradle hold on the right breast and mother agreed.  Mother did a little bit of pre-pumping with the manual pump to help evert nipple.  Baby then latched and took a few sucks.  Her suck was strong and a few audible swallows were noted but baby remained disinterested in breast feeding at this time.  I suggested mother pump with the DEBP at this time and call me for the next feeding session so I can assist with latching.  Mother agreed to call for latch assistance.  I have got to question how dedicated this mother is with exclusively breast feeding.  Baby also had a pacifier in her mouth so I discussed the importance of not using the pacifier at this time if possible.  Reviewed phototherapy guidelines and time spent out of lights.  Discussed bilirubin levels and what makes the levels come down.  Encouraged feeding on cue and to supplement as needed.    Mother's support person willing to assist with cleaning pump parts and  will call for any further questions/concerns.  Mother has a [facility] appointment on Nov. 1 in [place].  She will call for next feeding.   Maternal Data Has patient been taught Hand Expression?: Yes Does the patient have breastfeeding experience prior to this delivery?: No  Feeding Feeding Type: Breast Fed  LATCH Score Latch: Repeated attempts needed to sustain latch, nipple held in mouth throughout feeding, stimulation needed to elicit sucking reflex.  Audible Swallowing: A few with stimulation  Type of Nipple: Everted at rest and after stimulation(short shafted)  Comfort (Breast/Nipple): Soft / non-tender  Hold (Positioning): Assistance needed to correctly position infant at breast and maintain latch.  LATCH Score: 7  Interventions Interventions: Breast feeding basics reviewed;Assisted with latch;Skin to skin;Breast massage;Hand express;Pre-pump if needed;Position options;Support pillows;Adjust position;Breast compression;Hand pump;DEBP  Lactation Tools Discussed/Used WIC Program: Yes   Consult Status Consult Status: Complete Date: 10/08/18 Follow-up type: Call as needed    Lanice Schwab Dillan Lunden 10/08/2018, 9:39 AM

## 2018-11-02 ENCOUNTER — Ambulatory Visit: Payer: Self-pay | Admitting: Obstetrics and Gynecology

## 2018-11-02 ENCOUNTER — Encounter: Payer: Self-pay | Admitting: Family Medicine

## 2018-12-27 ENCOUNTER — Encounter (HOSPITAL_COMMUNITY): Payer: Self-pay

## 2019-07-18 ENCOUNTER — Other Ambulatory Visit: Payer: Self-pay

## 2019-07-18 ENCOUNTER — Emergency Department (HOSPITAL_COMMUNITY)
Admission: EM | Admit: 2019-07-18 | Discharge: 2019-07-18 | Disposition: A | Discharge status: Home / Self Care | Payer: Medicaid Other | Attending: Emergency Medicine | Admitting: Emergency Medicine

## 2019-07-18 ENCOUNTER — Encounter (HOSPITAL_COMMUNITY): Payer: Self-pay

## 2019-07-18 DIAGNOSIS — R569 Unspecified convulsions: Secondary | ICD-10-CM | POA: Insufficient documentation

## 2019-07-18 DIAGNOSIS — Z87891 Personal history of nicotine dependence: Secondary | ICD-10-CM | POA: Diagnosis not present

## 2019-07-18 DIAGNOSIS — K0889 Other specified disorders of teeth and supporting structures: Secondary | ICD-10-CM | POA: Diagnosis not present

## 2019-07-18 MED ORDER — LORAZEPAM 2 MG/ML IJ SOLN: 0.5000 mg | Freq: Once | INTRAMUSCULAR | Status: DC

## 2019-07-18 MED ORDER — LORAZEPAM 2 MG/ML IJ SOLN: 4.0000 mg | INTRAMUSCULAR | Status: DC | PRN

## 2019-07-18 MED ORDER — KETOROLAC TROMETHAMINE 30 MG/ML IJ SOLN: 30.0000 mg | Freq: Once | INTRAMUSCULAR | Status: DC

## 2019-07-18 MED ORDER — ACETAMINOPHEN 500 MG PO TABS: 1000.0000 mg | ORAL_TABLET | Freq: Once | ORAL | Status: DC

## 2019-07-18 NOTE — ED Provider Notes (Signed)
[facility] DEPT Provider Note   CSN: [id number] Arrival date & time: 07/18/19  1251    History   Chief Complaint No chief complaint on file.   HPI Carla Munoz is a 27 y.o. female with h/o depression, seizure disorder, tobacco use brought to ER by EMS for evaluation of possible seizure activity. Per triage note pt arrived home and fell in the front yard forward and per friend on scene patient has a grand mal type seizure.  Per EMS pt was not noted to be post-ictal upon their arrival.  No interventions PTA. Pt states he had dental surgery earlier this morning. 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  She states the procedure was done "without any drugs" and when she was leaving the dentist office she had severe pain. She states she told the dentist but she wasn't given any pain medicines or prescriptions. She arrived home and the dental pain worsened. Dental pain described as constant throbbing severe in right upper gumline associated with right sided forehead. States pain gives her seizures. She felt a seizure come on because she felt bad. She remembers falling forward on her yard, landing on her knees and face.  She is crying, rocking in bed, using ice bag to hit herself on te right side of the face to "help with the pain".  She denies any headache, vision changes or loss, vomiting, neck pain. Denies illicit drug use. She is takes 500 mg keppra BID and has been complaint.  She hasn't seen a neurologist in years but states "hospital doctors" usually give her a prescription.      HPI  Past Medical History:  Diagnosis Date  . Anemia   . Bipolar 1 disorder with moderate mania ([place])   . Brain tumor ([place])   . Seizures [facility])     Patient Active Problem List   Diagnosis Date Noted  . Supervision of pregnancy with insufficient antenatal care 10/04/2018  . Anemia, antepartum 10/04/2018  . Tobacco smoking affecting pregnancy, antepartum 10/04/2018  .  Uterine size date discrepancy, antepartum 10/04/2018  . Seizure disorder during pregnancy, antepartum ([place]) 10/04/2018  . History of syncope 10/04/2018  . AFI (amniotic fluid index) borderline low 10/04/2018  . Oligohydramnios 10/04/2018  . 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 BRAIN SURGERY    . MOUTH SURGERY       OB History    Gravida  1   Para  0   Term  0   Preterm  0   AB  0   Living  0     SAB  0   TAB  0   Ectopic  0   Multiple  0   Live Births  0            Home Medications    Prior to Admission medications   Medication Sig Start Date End Date Taking? Authorizing Provider  ibuprofen (ADVIL,MOTRIN) 600 MG tablet Take 1 tablet (600 mg total) by mouth every 6 (six) hours. 10/07/18   Cresenzo-Dishmon, Joaquim Lai, CNM  levETIRAcetam (KEPPRA) 500 MG tablet Take 2 tablets (1,000 mg total) by mouth at bedtime. Patient taking differently: Take 500 mg by mouth at bedtime.  09/29/18   Tamala Julian, [place], [place]  Prenatal Vit-Fe Fumarate-FA (PRENATAL MULTIVITAMIN) TABS tablet Take 1 tablet by mouth daily at 12 noon.    [provider]  Family History No family history on file.  Social History Social History   Tobacco Use  . Smoking status: Former Smoker    Packs/day: 0.50    Types: Cigarettes  . Smokeless tobacco: Never Used  Substance Use Topics  . Alcohol use: Not Currently    Comment: socially  . Drug use: No     Allergies   Banana, Chocolate, Kiwi extract, Lactose intolerance (gi), Orange fruit [citrus], and Other   Review of Systems Review of Systems  HENT: Positive for dental problem.   Neurological: Positive for seizures and headaches.  All other systems reviewed and are negative.    Physical Exam Updated Vital Signs BP (!) 136/101   Pulse 70   Resp 16   Ht 5\' 7"  (1.702 m)   Wt 79.4 kg   LMP  (LMP Unknown)   SpO2  100%   BMI 27.41 kg/m   Physical Exam Vitals signs and nursing note reviewed.  Constitutional:      General: She is not in acute distress.    Appearance: She is well-developed.     Comments: NAD.  HENT:     Head: Normocephalic and atraumatic.     Comments: No reproducible right sided scalp tenderness    Right Ear: External ear normal.     Left Ear: External ear normal.     Nose: Nose normal.     Mouth/Throat:     Dentition: Dental tenderness present.     Comments: Sutures noted to right upper gumline, no bleeding. No pus or drainage.  No local edema, erythema, facial or lip edema. Eyes:     General: No scleral icterus.    Conjunctiva/sclera: Conjunctivae normal.  Neck:     Musculoskeletal: Normal range of motion and neck supple.  Cardiovascular:     Rate and Rhythm: Normal rate and regular rhythm.     Heart sounds: Normal heart sounds.  Pulmonary:     Effort: Pulmonary effort is normal.     Breath sounds: Normal breath sounds.  Musculoskeletal: Normal range of motion.        General: No deformity.  Skin:    General: Skin is warm and dry.     Capillary Refill: Capillary refill takes less than 2 seconds.  Neurological:     Mental Status: She is alert and oriented to person, place, and time.     Comments: Alert and oriented to self, place, time and event.  Speech is fluent without dysarthria or dysphasia. Strength 5/5 in upper/lower extremities   Sensation to light touch intact in face, upper/lower extremities. Normal gait No pronator drift. No leg drop. Normal finger-to-nose and feet tapping.  CN I not tested CN II grossly intact visual fields bilaterally. Unable to visualize posterior eye. CN III, IV, VI PEERL and EOMs intact bilaterally CN V light touch intact in all 3 divisions of trigeminal nerve CN VII facial movements symmetric CN VIII not tested CN IX, X no uvula deviation, symmetric rise of soft palate  CN XI 5/5 SCM and trapezius strength bilaterally  CN XII  Midline tongue protrusion, symmetric L/R movements   Psychiatric:        Mood and Affect: Mood is anxious. Affect is labile.        Behavior: Behavior is agitated.        Judgment: Judgment is inappropriate.     Comments:  Patient found in room pacing, screaming in pain. She is holding a bag of ice to her right face, intermittently  hitting herself on the right face/mouth with it. Poor eye contact. Tearful.     ED Treatments / Results  Labs (all labs ordered are listed, but only abnormal results are displayed) Labs Reviewed - No data to display  EKG None  Radiology No results found.  Procedures Procedures (including critical care time)  Medications Ordered in ED Medications - No data to display   Initial Impression / Assessment and Plan / ED Course  I have reviewed the triage vital signs and the nursing notes.  Pertinent labs & imaging results that were available during my care of the patient were reviewed by me and considered in my medical decision making (see chart for details).  Patient with likely recurrent/break through seizure, uncomplicated.  Normal neuro exam. No signs of significant head, C spine trauma.   Will obtain lab work to r/o significant electrolyte abnormalities. On seizure precautions. Will give Keppra bolus, and anti inflammatories for dental pain.   2104: RN notified me patient has been pacing out of room several times asking to leave.  I re-evaluated patient and explained indication for labs today to r/o other electrolyte abnormalities, observation for recurrent seizure.  Initially she was agreeable with this however again called out requesting to be discharged. I spoke to patient again and she requested discharge. Discussed need for screening labs, observation.  Discussed risks of leaving without recommended work up, possibility of life threatening electrolyte abnormalities, recurrent seizure. She assumed risks and opted to leave AMA.   Final Clinical  Impressions(s) / ED Diagnoses   Final diagnoses:  Seizure-like activity [facility])  Pain, dental    ED Discharge Orders    None       Arlean Hopping 07/18/19 2106    Blanchie Dessert, MD 07/19/19 [phone]

## 2019-07-18 NOTE — ED Notes (Signed)
Seizure pads placed on bed railing.

## 2019-07-18 NOTE — ED Triage Notes (Signed)
Per EMS: Pt had dental surgery this am.  Pt got home at noon.  Pt's friend states she had a seizure and fell in the front yard.  On arrival pt was A&Ox4.  Pt was non-communicative due to dental pain.  Pt states she felt like she had an aura, and a period of black-out.  Friend on scene states what looked like grand-mal seizure activity.  Pt did not appear post-ictal on arrival.  Pt hx of seizures.

## 2019-07-18 NOTE — ED Notes (Signed)
Pt given ice to help with dental pain, exacerbated by fall.  Pt now hitting herself in the head with the ice bag stating, "it will distract me from the pain!"  Pt informed that this will not help, and asked to stop.  Pt is tearful and very upset during triage.

## 2019-07-18 NOTE — ED Notes (Signed)
Pt requesting to leave.
